# Patient Record
Sex: Male | Born: 1980 | Race: White | Hispanic: No | Marital: Married | Smoking: Never smoker
Health system: Southern US, Community
[De-identification: ages and names within clinical notes are randomized; demographics above are authoritative.]

## PROBLEM LIST (undated history)

## (undated) DIAGNOSIS — E785 Hyperlipidemia, unspecified: Secondary | ICD-10-CM

## (undated) DIAGNOSIS — G8929 Other chronic pain: Secondary | ICD-10-CM

## (undated) DIAGNOSIS — M545 Low back pain, unspecified: Secondary | ICD-10-CM

## (undated) DIAGNOSIS — R079 Chest pain, unspecified: Secondary | ICD-10-CM

## (undated) DIAGNOSIS — N419 Inflammatory disease of prostate, unspecified: Secondary | ICD-10-CM

## (undated) DIAGNOSIS — Z9089 Acquired absence of other organs: Secondary | ICD-10-CM

## (undated) HISTORY — PX: TONSILLECTOMY: SUR1361

## (undated) HISTORY — PX: FOOT SURGERY: SHX648

## (undated) HISTORY — DX: Acquired absence of other organs: Z90.89

## (undated) HISTORY — DX: Low back pain: M54.5

## (undated) HISTORY — DX: Inflammatory disease of prostate, unspecified: N41.9

## (undated) HISTORY — DX: Hyperlipidemia, unspecified: E78.5

## (undated) HISTORY — PX: TARSAL TUNNEL RELEASE: SUR1099

## (undated) HISTORY — DX: Other chronic pain: G89.29

## (undated) HISTORY — DX: Low back pain, unspecified: M54.50

## (undated) HISTORY — DX: Chest pain, unspecified: R07.9

---
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
## 2001-12-20 ENCOUNTER — Encounter: Payer: Self-pay | Admitting: Orthopedic Surgery

## 2001-12-20 ENCOUNTER — Encounter: Admission: RE | Admit: 2001-12-20 | Discharge: 2001-12-20 | Payer: Self-pay | Admitting: Orthopedic Surgery

## 2004-03-19 ENCOUNTER — Encounter: Admission: RE | Admit: 2004-03-19 | Discharge: 2004-03-19 | Payer: Self-pay | Admitting: Specialist

## 2006-11-15 ENCOUNTER — Emergency Department (HOSPITAL_COMMUNITY): Admission: EM | Admit: 2006-11-15 | Discharge: 2006-11-15 | Payer: Self-pay | Admitting: Emergency Medicine

## 2008-09-09 ENCOUNTER — Encounter: Admission: RE | Admit: 2008-09-09 | Discharge: 2008-09-09 | Payer: Self-pay | Admitting: Family Medicine

## 2011-06-21 ENCOUNTER — Encounter: Payer: Self-pay | Admitting: Emergency Medicine

## 2011-06-21 ENCOUNTER — Emergency Department
Admit: 2011-06-21 | Discharge: 2011-06-21 | Disposition: A | Discharge status: Home / Self Care | Payer: 59 | Attending: Emergency Medicine | Admitting: Emergency Medicine

## 2011-06-21 ENCOUNTER — Emergency Department
Admission: EM | Admit: 2011-06-21 | Discharge: 2011-06-21 | Disposition: A | Discharge status: Home / Self Care | Payer: 59 | Source: Home / Self Care | Attending: Emergency Medicine | Admitting: Emergency Medicine

## 2011-06-21 DIAGNOSIS — J0100 Acute maxillary sinusitis, unspecified: Secondary | ICD-10-CM

## 2011-06-21 DIAGNOSIS — J209 Acute bronchitis, unspecified: Secondary | ICD-10-CM

## 2011-06-21 DIAGNOSIS — B9689 Other specified bacterial agents as the cause of diseases classified elsewhere: Secondary | ICD-10-CM

## 2011-06-21 MED ORDER — PROMETHAZINE-CODEINE 6.25-10 MG/5ML PO SYRP: ORAL_SOLUTION | ORAL | Status: AC

## 2011-06-21 MED ORDER — LEVOFLOXACIN 500 MG PO TABS: ORAL_TABLET | ORAL | Status: AC

## 2011-06-21 NOTE — ED Provider Notes (Signed)
History     CSN: [id number]  Arrival date & time 06/21/11  1744   First MD Initiated Contact with Patient 06/21/11 1805      Chief Complaint  Patient presents with  . Cough     Patient is a 31 y.o. male presenting with cough. The history is provided by the patient.  Cough  URI HISTORY  Todd Stanley is a 31 y.o. male who complains of onset of cold symptoms for 4 days.  Have been using over-the-counter treatment which is not helping the cough and congestion  Positive chills/sweats +  Fever to 102  +  Nasal congestion +  Discolored Post-nasal drainage Positive sinus pain/pressure Positive sore throat  +  Severe, hacking cough, productive of green sputum. Occasionally, cough is so severe that it can induce vomiting. No wheezing + chest congestion No hemoptysis No shortness of breath No pleuritic pain  No itchy/red eyes No earache  No nausea Rare vomiting No abdominal pain No diarrhea  No skin rashes +  Fatigue No myalgias No headache   History reviewed. No pertinent past medical history. He received a flu shot about 2 months ago.  Past Surgical History  Procedure Date  . Tonsillectomy     History reviewed. No pertinent family history.  History  Substance Use Topics  . Smoking status: Not on file  . Smokeless tobacco: Not on file  . Alcohol Use:       Review of Systems  Respiratory: Positive for cough.     Allergies  E-mycin  Home Medications   Current Outpatient Rx  Name Route Sig Dispense Refill  . IBUPROFEN 200 MG PO TABS Oral Take 200 mg by mouth every 6 (six) hours as needed.    Marland Kitchen LEVOFLOXACIN 500 MG PO TABS  Take 1 tablet daily for 10 days. 10 tablet 0  . PROMETHAZINE-CODEINE 6.25-10 MG/5ML PO SYRP  Take 1-2 teaspoons every 4-6 hours as needed for cough. May cause drowsiness. 120 mL 0    BP 132/78  Pulse 104  Temp(Src) 98.3 F (36.8 C) (Oral)  Resp 18  Ht 6\' 4"  (1.93 m)  Wt 212 lb (96.163 kg)  BMI 25.81 kg/m2  SpO2 99%  Physical  Exam  Nursing note and vitals reviewed. Constitutional: He is oriented to person, place, and time. He appears well-developed and well-nourished. He appears distressed (Very fatigued, appears uncomfortable from cough, but no acute cardiorespiratory distress. He does not appear toxic. He is alert and cooperative.).  HENT:  Head: Normocephalic and atraumatic.  Right Ear: Tympanic membrane normal.  Left Ear: Tympanic membrane normal.  Nose: Mucosal edema and rhinorrhea present. Right sinus exhibits maxillary sinus tenderness. Right sinus exhibits no frontal sinus tenderness. Left sinus exhibits maxillary sinus tenderness. Left sinus exhibits no frontal sinus tenderness.  Mouth/Throat: Mucous membranes are normal. Posterior oropharyngeal erythema (Mild redness, no exudate. Tonsils are absent.) present. No oropharyngeal exudate, posterior oropharyngeal edema or tonsillar abscesses.       He is very hoarse  Eyes: Right eye exhibits no discharge. Left eye exhibits no discharge. No scleral icterus.  Neck: Neck supple. No Brudzinski's sign and no Kernig's sign noted.  Cardiovascular: Normal rate, regular rhythm and normal heart sounds.   Pulmonary/Chest: No respiratory distress. He has no wheezes. He has rhonchi (Scattered anterior and posterior rhonchi bilaterally). He has no rales.       No definite rales. Initially he had some fine rales bibasilar posterior lung fields, but that cleared after coughing.  Lymphadenopathy:  He has cervical adenopathy (Very mild, mobile, shoddy anterior cervical nodes bilaterally).  Neurological: He is alert and oriented to person, place, and time.  Skin: Skin is warm. No rash noted. He is diaphoretic.    ED Course  Procedures (including critical care time)   Labs Reviewed - No data to display Dg Chest 2 View  06/21/2011  *RADIOLOGY REPORT*  Clinical Data: Cough and fever.  CHEST - 2 VIEW  Comparison: Two-view chest 11/15/2006.  Findings: The heart size is normal.   The lungs are clear.  The visualized soft tissues and bony thorax are unremarkable.  IMPRESSION: Negative chest.  Original Report Authenticated By: Jamesetta Orleans. MATTERN, M.D.     1. Acute bacterial bronchitis   2. Acute maxillary sinusitis       MDM  Discussed treatment options. Reviewed the chest x-ray was within normal limits. See detailed Instructions in AVS, which were given to patient. Verbal instructions also given. Questions invited and answered. Patient voiced understanding and agreement with plans.        Lonell Face, MD 06/21/11 [phone]

## 2011-06-21 NOTE — ED Notes (Signed)
Cough, fever, congestion, green mucus, chills x 4 days

## 2011-10-19 ENCOUNTER — Emergency Department
Admission: EM | Admit: 2011-10-19 | Discharge: 2011-10-19 | Disposition: A | Discharge status: Home / Self Care | Payer: 59 | Source: Home / Self Care | Attending: Emergency Medicine | Admitting: Emergency Medicine

## 2011-10-19 ENCOUNTER — Encounter: Payer: Self-pay | Admitting: Emergency Medicine

## 2011-10-19 DIAGNOSIS — J329 Chronic sinusitis, unspecified: Secondary | ICD-10-CM

## 2011-10-19 MED ORDER — AZITHROMYCIN 250 MG PO TABS: ORAL_TABLET | ORAL | Status: AC

## 2011-10-19 NOTE — ED Provider Notes (Signed)
History     CSN: [id number]  Arrival date & time 10/19/11  4098   First MD Initiated Contact with Patient 10/19/11 1836      Chief Complaint  Patient presents with  . Facial Pain    (Consider location/radiation/quality/duration/timing/severity/associated sxs/prior treatment) HPI Todd Stanley is a 31 y.o. male who complains of onset of cold symptoms for 10 days.  The symptoms are constant and mild-moderate in severity.  He is taking over-the-counter generic Claritin and some Sudafed as well.  It is helping a little bit but he thinks that the sinus pressure is worsening.   No sore throat + cough No pleuritic pain No wheezing + nasal congestion + post-nasal drainage ++ sinus pain/pressure with green discharge  No chest congestion No itchy/red eyes No earache No hemoptysis No SOB No chills/sweats No fever No nausea No vomiting No abdominal pain No diarrhea No skin rashes No fatigue No myalgias No headache    History reviewed. No pertinent past medical history.  Past Surgical History  Procedure Date  . Tonsillectomy     History reviewed. No pertinent family history.  History  Substance Use Topics  . Smoking status: Not on file  . Smokeless tobacco: Not on file  . Alcohol Use:       Review of Systems  All other systems reviewed and are negative.    Allergies  E-mycin  Home Medications   Current Outpatient Rx  Name Route Sig Dispense Refill  . PSEUDOEPHEDRINE HCL 30 MG PO TABS Oral Take 30 mg by mouth every 4 (four) hours as needed.    . AZITHROMYCIN 250 MG PO TABS  Use as directed 1 each 0  . IBUPROFEN 200 MG PO TABS Oral Take 200 mg by mouth every 6 (six) hours as needed.      BP 126/85  Pulse 82  Temp(Src) 97.3 F (36.3 C) (Oral)  Resp 16  Ht 6\' 4"  (1.93 m)  Wt 214 lb (97.07 kg)  BMI 26.05 kg/m2  SpO2 98%  Physical Exam  Nursing note and vitals reviewed. Constitutional: He is oriented to person, place, and time. He appears well-developed  and well-nourished.  HENT:  Head: Normocephalic and atraumatic.  Right Ear: Tympanic membrane, external ear and ear canal normal.  Left Ear: External ear and ear canal normal. Tympanic membrane is erythematous. A middle ear effusion is present.  Nose: Mucosal edema present.  Mouth/Throat: Mucous membranes are normal. No oropharyngeal exudate, posterior oropharyngeal edema or posterior oropharyngeal erythema.  Eyes: No scleral icterus.  Neck: Neck supple.  Cardiovascular: Regular rhythm and normal heart sounds.   Pulmonary/Chest: Effort normal and breath sounds normal. No respiratory distress.  Neurological: He is alert and oriented to person, place, and time.  Skin: Skin is warm and dry.  Psychiatric: He has a normal mood and affect. His speech is normal.    ED Course  Procedures (including critical care time)  Labs Reviewed - No data to display No results found.   1. Sinusitis       MDM  1)  Take the prescribed antibiotic as instructed.  He reports an allergy to erythromycin, but but has taken Z-Pak in the past which has worked for him and he has not had any side effects with that.  If his symptoms are lingering past 4 days or so, he will call back and we can call in a second Z-Pak.  He specifically asked that we cannot treat him with amoxicillin because he has used it so  many times previously. 2)  Use nasal saline solution (over the counter) at least 3 times a day. 3)  Use over the counter decongestants like Zyrtec-D every 12 hours as needed to help with congestion.  If you have hypertension, do not take medicines with sudafed.  4)  Can take tylenol every 6 hours or motrin every 8 hours for pain or fever. 5)  Follow up with your primary doctor if no improvement in 5-7 days, sooner if increasing pain, fever, or new symptoms.     Marlaine Hind, MD 10/19/11 [phone]

## 2011-10-19 NOTE — ED Notes (Signed)
Sinus pressure, pain, mucus went from clear to green x 10 days

## 2011-10-23 ENCOUNTER — Telehealth: Payer: Self-pay | Admitting: Emergency Medicine

## 2011-11-04 ENCOUNTER — Telehealth: Payer: Self-pay | Admitting: Family Medicine

## 2011-11-04 NOTE — Telephone Encounter (Signed)
Message copied by Royetta Asal on Sat Nov 04, 2011  4:07 PM ------      Message from: Donna Christen A      Created: Mon Oct 23, 2011  2:50 PM      Regarding: RE: another course of antibiotics?       May call in another RX for a Z-pack.      ----- Message -----         From: Royetta Asal, RN         Sent: 10/23/2011  12:52 PM           To: Lattie Haw, MD      Subject: another course of antibiotics?                           Pt. States he has completed Z-pack and not recovered; wonders if another course of this would be appropriate?

## 2012-01-25 ENCOUNTER — Emergency Department (INDEPENDENT_AMBULATORY_CARE_PROVIDER_SITE_OTHER): Payer: 59

## 2012-01-25 ENCOUNTER — Encounter: Payer: Self-pay | Admitting: *Deleted

## 2012-01-25 ENCOUNTER — Emergency Department
Admission: EM | Admit: 2012-01-25 | Discharge: 2012-01-25 | Disposition: A | Discharge status: Home / Self Care | Payer: 59 | Source: Home / Self Care

## 2012-01-25 DIAGNOSIS — R05 Cough: Secondary | ICD-10-CM

## 2012-01-25 DIAGNOSIS — J189 Pneumonia, unspecified organism: Secondary | ICD-10-CM

## 2012-01-25 MED ORDER — HYDROCOD POLST-CHLORPHEN POLST 10-8 MG/5ML PO LQCR: 5.0000 mL | Freq: Two times a day (BID) | ORAL | Status: DC | PRN

## 2012-01-25 MED ORDER — DOXYCYCLINE HYCLATE 100 MG PO CAPS: 100.0000 mg | ORAL_CAPSULE | Freq: Two times a day (BID) | ORAL | Status: AC

## 2012-01-25 MED ORDER — CEFTRIAXONE SODIUM 1 G IJ SOLR
1.0000 g | Freq: Once | INTRAMUSCULAR | Status: AC
  Administered 2012-01-25: 1 g via INTRAMUSCULAR

## 2012-01-25 NOTE — ED Notes (Addendum)
Patient swallowed a lifesaver 1 week ago. About 6 hours later is when cough started. Dry cough without associated symptoms or fever. Used nyquil and cough drops otc.

## 2012-01-25 NOTE — ED Provider Notes (Signed)
History     CSN: [id number]  Arrival date & time 01/25/12  4098   First MD Initiated Contact with Patient 01/25/12 1815      Chief Complaint  Patient presents with  . Cough   HPI Patient presents today with chief complaint of cough. Patient states that he had nearly choked on lifesaver approximately one week ago. Patient states he drank a large couple water and was able to clear to lifesaver. Patient states that since this point he has had persistent cough without relief. Patient states he's been trying over-the-counter medications include cough drops with minimal improvement in symptoms. No fevers, chills, shortness of breath, wheezing. Patient states he has developed anterior chest pleuritic chest pain with coughing. No nausea diaphoresis. Cough is nonproductive. No prior history smoking. Currently not wheezing. Currently not on medications including ACE inhibitor. Otherwise healthy. No rhinorrhea. Patient states that cough is gone to the point to where he cannot speak in full sentences secondary to persistence of cough. History reviewed. No pertinent past medical history.  Past Surgical History  Procedure Date  . Tonsillectomy     History reviewed. No pertinent family history.  History  Substance Use Topics  . Smoking status: Never Smoker   . Smokeless tobacco: Not on file  . Alcohol Use: No      Review of Systems  Constitutional: Negative for fever, chills, diaphoresis, fatigue and unexpected weight change.  HENT: Negative for facial swelling, rhinorrhea, neck stiffness and postnasal drip.   Respiratory: Positive for cough. Negative for chest tightness, shortness of breath, wheezing and stridor.   Cardiovascular: Positive for chest pain.  Gastrointestinal: Negative for nausea, abdominal pain and diarrhea.  Neurological: Positive for headaches.  Hematological: Negative for adenopathy.    Allergies  E-mycin  Home Medications   Current Outpatient Rx  Name Route Sig  Dispense Refill  . IBUPROFEN 200 MG PO TABS Oral Take 200 mg by mouth every 6 (six) hours as needed.    Marland Kitchen PSEUDOEPHEDRINE HCL 30 MG PO TABS Oral Take 30 mg by mouth every 4 (four) hours as needed.      BP 141/93  Pulse 113  Temp 98 F (36.7 C) (Oral)  Resp 16  Ht 6\' 4"  (1.93 m)  Wt 209 lb (94.802 kg)  BMI 25.44 kg/m2  SpO2 98%  Physical Exam  Constitutional: He is oriented to person, place, and time. He appears well-developed and well-nourished. No distress.       Actively coughing.   HENT:  Head: Normocephalic and atraumatic.  Eyes: Conjunctivae are normal. Pupils are equal, round, and reactive to light.  Neck: Normal range of motion. Neck supple.  Cardiovascular: Normal rate and regular rhythm.   Pulmonary/Chest: Effort normal and breath sounds normal. No respiratory distress. He has no wheezes. He has no rales.  Abdominal: Soft. Bowel sounds are normal.  Musculoskeletal: Normal range of motion.  Lymphadenopathy:    He has no cervical adenopathy.  Neurological: He is alert and oriented to person, place, and time.  Skin: Skin is warm.    ED Course  Procedures (including critical care time)  Labs Reviewed - No data to display Dg Chest 2 View  01/25/2012  *RADIOLOGY REPORT*  Clinical Data: History of cough.  CHEST - 2 VIEW  Comparison: Chest x-ray 06/21/2011.  Findings: Lung volumes are normal.  No consolidative airspace disease.  No pleural effusions.  No pneumothorax.  No pulmonary nodule or mass noted.  Pulmonary vasculature and the cardiomediastinal silhouette are within  normal limits.  IMPRESSION: 1. No radiographic evidence of acute cardiopulmonary disease.   Original Report Authenticated By: Florencia Reasons, M.D.     1. Cough  2. Pneumonia    MDM  Suspect that cough is likely secondary to aspiration event. Will cover for pneumonia  with Rocephin 1 g IM x1 and doxycycline 100 mg by mouth twice a day x7 days. (Noted allergy to macrolides) Will place on Tussionex  for cough. Overall respiratory infectious red flags were discussed with patient Given the patient is currently presenting in the outpatient setting with no prior history of respiratory disease or recent hospitalizations, will treat this along the outpatient pneumonia algorithm.  If cough persists, would consider escalation antibiotics or respiratory flora quinolone versus referral to ENT for laryngoscopy. Handout given.     The patient and/or caregiver has been counseled thoroughly with regard to treatment plan and/or medications prescribed including dosage, schedule, interactions, rationale for use, and possible side effects and they verbalize understanding. Diagnoses and expected course of recovery discussed and will return if not improved as expected or if the condition worsens. Patient and/or caregiver verbalized understanding.            Doree Albee, MD 01/25/12 [phone]

## 2012-03-10 ENCOUNTER — Emergency Department
Admission: EM | Admit: 2012-03-10 | Discharge: 2012-03-10 | Disposition: A | Discharge status: Home / Self Care | Payer: 59 | Source: Home / Self Care | Attending: Family Medicine | Admitting: Family Medicine

## 2012-03-10 DIAGNOSIS — J029 Acute pharyngitis, unspecified: Secondary | ICD-10-CM

## 2012-03-10 MED ORDER — PENICILLIN V POTASSIUM 500 MG PO TABS: ORAL_TABLET | ORAL | Status: DC

## 2012-03-10 NOTE — ED Provider Notes (Signed)
History     CSN: [id number]  Arrival date & time 03/10/12  1404   First MD Initiated Contact with Patient 03/10/12 1415      Chief Complaint  Patient presents with  . Sore Throat    x 4 days      HPI Comments: Patient complains of onset of sore throat 4 days ago, gradually becoming worse.  He has had fever for the past two days, increased to 103 today.  No cough or URI symptoms.  He states that he has had strep pharyngitis in the past documented only by throat culture (with negative rapid strep tests).   History reviewed. No pertinent past medical history.  Past Surgical History  Procedure Date  . Tonsillectomy     Family History  Problem Relation Age of Onset  . Hyperlipidemia Mother   . Hypertension Mother   . Cancer Father     testicular  . Hypertension Father   . Hyperlipidemia Father   . Cancer Sister     thyroid    History  Substance Use Topics  . Smoking status: Never Smoker   . Smokeless tobacco: Not on file  . Alcohol Use: No      Review of Systems + sore throat No cough No pleuritic pain No wheezing No nasal congestion No post-nasal drainage No sinus pain/pressure No itchy/red eyes ? earache No hemoptysis No SOB + fever, + chills No nausea No vomiting No abdominal pain No diarrhea No urinary symptoms No skin rashes + fatigue + myalgias No headache Used OTC meds without relief  Allergies  E-mycin  Home Medications   Current Outpatient Rx  Name Route Sig Dispense Refill  . IBUPROFEN 200 MG PO TABS Oral Take 200 mg by mouth every 6 (six) hours as needed.    Marland Kitchen HYDROCOD POLST-CPM POLST ER 10-8 MG/5ML PO LQCR Oral Take 5 mLs by mouth every 12 (twelve) hours as needed (cough). 60 mL 0  . PENICILLIN V POTASSIUM 500 MG PO TABS  Take one tab by mouth twice daily for 10 days 20 tablet 0  . PSEUDOEPHEDRINE HCL 30 MG PO TABS Oral Take 30 mg by mouth every 4 (four) hours as needed.      BP 137/86  Pulse 119  Temp 101.3 F (38.5 C)  (Oral)  Resp 18  Ht 6\' 4"  (1.93 m)  Wt 214 lb (97.07 kg)  BMI 26.05 kg/m2  SpO2 97%  Physical Exam Nursing notes and Vital Signs reviewed. Appearance:  Patient appears healthy, stated age, and in no acute distress Eyes:  Pupils are equal, round, and reactive to light and accomodation.  Extraocular movement is intact.  Conjunctivae are not inflamed  Ears:  Canals normal.  Tympanic membranes normal.  Nose:  Mildly congested turbinates.  No sinus tenderness.   Pharynx:  Mildly erythematous without swelling or exudate Neck:  Supple.   Tender anterior/posterior nodes are palpated bilaterally, worse on left  Lungs:  Clear to auscultation.  Breath sounds are equal.  Heart:  Regular rate and rhythm without murmurs, rubs, or gallops.  Abdomen:   Mild tenderness over spleen without masses or hepatosplenomegaly.  Bowel sounds are present.  No CVA or flank tenderness.  Extremities:  No edema.  No calf tenderness Skin:  No rash present.   ED Course  Procedures  none   Labs Reviewed  POCT RAPID STREP A (OFFICE) - Negative  STREP A DNA PROBE pending       1. Sore throat  MDM  Throat culture pending.  Centor score 3.  Patient has had a number of episodes of strep pharyngitis in the past with negative rapid strep tests, but positive cultures.  Therefore will empirically begin penicillin. May take Ibuprofen 200mg , 4 tabs every 8 hours with food.  Try warm salt water gargles. Followup with Family Doctor if not improved in one week.         Lattie Haw, MD 03/10/12 1444

## 2012-03-10 NOTE — ED Notes (Signed)
Todd Stanley complains of a sore throat, body aches, fever, chills and sweats for 4 days. He feels that he is getting worse. He has taken Advil and Aleve for the sore throat. The medication has helped but wears off.

## 2012-03-12 ENCOUNTER — Telehealth: Payer: Self-pay | Admitting: *Deleted

## 2012-05-07 ENCOUNTER — Emergency Department
Admission: EM | Admit: 2012-05-07 | Discharge: 2012-05-07 | Disposition: A | Discharge status: Home / Self Care | Payer: 59 | Source: Home / Self Care | Attending: Family Medicine | Admitting: Family Medicine

## 2012-05-07 ENCOUNTER — Encounter: Payer: Self-pay | Admitting: *Deleted

## 2012-05-07 DIAGNOSIS — J069 Acute upper respiratory infection, unspecified: Secondary | ICD-10-CM

## 2012-05-07 DIAGNOSIS — J40 Bronchitis, not specified as acute or chronic: Secondary | ICD-10-CM

## 2012-05-07 MED ORDER — METHYLPREDNISOLONE SODIUM SUCC 125 MG IJ SOLR
125.0000 mg | Freq: Once | INTRAMUSCULAR | Status: AC
  Administered 2012-05-07: 125 mg via INTRAMUSCULAR

## 2012-05-07 MED ORDER — DOXYCYCLINE HYCLATE 100 MG PO CAPS: 100.0000 mg | ORAL_CAPSULE | Freq: Two times a day (BID) | ORAL | Status: AC

## 2012-05-07 MED ORDER — HYDROCOD POLST-CHLORPHEN POLST 10-8 MG/5ML PO LQCR: 5.0000 mL | Freq: Two times a day (BID) | ORAL | Status: DC | PRN

## 2012-05-07 NOTE — ED Notes (Signed)
Pt c/o non productive cough, RT side sinus pain, and nasal congestion x 2 wks, worse x 3 days. He has taken nyquil and dayquil. Denies fever.

## 2012-05-07 NOTE — ED Provider Notes (Signed)
History     CSN: [id number]  Arrival date & time 05/07/12  1916   First MD Initiated Contact with Patient 05/07/12 1923      Chief Complaint  Patient presents with  . Cough   HPI URI Symptoms Onset: 2 weeks  Description: rhinorrhea, nasal congestion, cough, mild wheezing  Modifying factors:  none  Symptoms Nasal discharge: yes Fever: no Sore throat: no Cough: yes Wheezing: mild Ear pain: no GI symptoms: no Sick contacts: yes  Red Flags  Stiff neck: no Dyspnea: mild after extended periods of coughing  Rash: no Swallowing difficulty: no  Sinusitis Risk Factors Headache/face pain: no Double sickening: no tooth pain: no  Allergy Risk Factors Sneezing: no Itchy scratchy throat: no Seasonal symptoms: no  Flu Risk Factors Headache: no muscle aches: no severe fatigue: no   History reviewed. No pertinent past medical history.  Past Surgical History  Procedure Date  . Tonsillectomy   . Tarsal tunnel release     Family History  Problem Relation Age of Onset  . Hyperlipidemia Mother   . Hypertension Mother   . Cancer Father     testicular  . Hypertension Father   . Hyperlipidemia Father   . Cancer Sister     thyroid    History  Substance Use Topics  . Smoking status: Never Smoker   . Smokeless tobacco: Not on file  . Alcohol Use: No      Review of Systems  All other systems reviewed and are negative.    Allergies  E-mycin  Home Medications   Current Outpatient Rx  Name  Route  Sig  Dispense  Refill  . HYDROCOD POLST-CPM POLST ER 10-8 MG/5ML PO LQCR   Oral   Take 5 mLs by mouth every 12 (twelve) hours as needed (cough).   60 mL   0   . IBUPROFEN 200 MG PO TABS   Oral   Take 200 mg by mouth every 6 (six) hours as needed.         Marland Kitchen PENICILLIN V POTASSIUM 500 MG PO TABS      Take one tab by mouth twice daily for 10 days   20 tablet   0   . PSEUDOEPHEDRINE HCL 30 MG PO TABS   Oral   Take 30 mg by mouth every 4 (four) hours  as needed.           BP 147/90  Pulse 96  Temp 98.4 F (36.9 C) (Oral)  Resp 18  Ht 6\' 4"  (1.93 m)  Wt 214 lb (97.07 kg)  BMI 26.05 kg/m2  SpO2 98%  Physical Exam  Constitutional: He appears well-developed and well-nourished.  HENT:  Head: Normocephalic and atraumatic.  Right Ear: External ear normal.  Left Ear: External ear normal.       +nasal erythema, rhinorrhea bilaterally, + post oropharyngeal erythema    Eyes: Conjunctivae normal are normal. Pupils are equal, round, and reactive to light.  Neck: Normal range of motion. Neck supple.  Cardiovascular: Normal rate, regular rhythm and normal heart sounds.   Pulmonary/Chest: Effort normal and breath sounds normal.  Abdominal: Soft.  Musculoskeletal: Normal range of motion.  Lymphadenopathy:    He has no cervical adenopathy.  Neurological: He is alert.  Skin: Skin is warm.    ED Course  Procedures (including critical care time)  Labs Reviewed - No data to display No results found.   1. URI (upper respiratory infection)   2. Bronchitis  MDM  Tussionex for cough  Doxy for lower resp tract coverage (macrolide allergy).  Solumedrol 125mg  IM x1 for wheezing.  Discussed general care and infectious/resp red flags.  Follow up as needed.      The patient and/or caregiver has been counseled thoroughly with regard to treatment plan and/or medications prescribed including dosage, schedule, interactions, rationale for use, and possible side effects and they verbalize understanding. Diagnoses and expected course of recovery discussed and will return if not improved as expected or if the condition worsens. Patient and/or caregiver verbalized understanding.             Doree Albee, MD 05/07/12 2001

## 2015-05-09 ENCOUNTER — Emergency Department
Admission: EM | Admit: 2015-05-09 | Discharge: 2015-05-09 | Disposition: A | Discharge status: Home / Self Care | Payer: 59 | Source: Home / Self Care | Attending: Family Medicine | Admitting: Family Medicine

## 2015-05-09 ENCOUNTER — Encounter: Payer: Self-pay | Admitting: Emergency Medicine

## 2015-05-09 ENCOUNTER — Emergency Department (INDEPENDENT_AMBULATORY_CARE_PROVIDER_SITE_OTHER): Payer: 59

## 2015-05-09 DIAGNOSIS — R05 Cough: Secondary | ICD-10-CM

## 2015-05-09 DIAGNOSIS — J069 Acute upper respiratory infection, unspecified: Secondary | ICD-10-CM

## 2015-05-09 DIAGNOSIS — B9789 Other viral agents as the cause of diseases classified elsewhere: Principal | ICD-10-CM

## 2015-05-09 MED ORDER — DOXYCYCLINE HYCLATE 100 MG PO CAPS: 100.0000 mg | ORAL_CAPSULE | Freq: Two times a day (BID) | ORAL | Status: DC

## 2015-05-09 MED ORDER — GUAIFENESIN-CODEINE 100-10 MG/5ML PO SOLN: ORAL | Status: DC

## 2015-05-09 MED ORDER — PREDNISONE 20 MG PO TABS: 20.0000 mg | ORAL_TABLET | Freq: Two times a day (BID) | ORAL | Status: DC

## 2015-05-09 NOTE — ED Notes (Signed)
Reports cough worsening over past week; non-productive; no fever; did have flu vaccination this season.

## 2015-05-09 NOTE — Discharge Instructions (Signed)
Take plain guaifenesin (1200mg  extended release tabs such as Mucinex) twice daily, with plenty of water, for cough and congestion.   Try warm salt water gargles for sore throat.  Stop all antihistamines for now, and other non-prescription cough/cold preparations. Begin Doxycycline if not improving about one week or if persistent fever develops   Follow-up with family doctor if not improving about10 days.

## 2015-05-09 NOTE — ED Provider Notes (Signed)
CSN: [id number]     Arrival date & time 05/09/15  1102 History   First MD Initiated Contact with Patient 05/09/15 1141     Chief Complaint  Patient presents with  . Cough      HPI Comments: Patient complains of one week history of typical cold-like symptoms including mild sore throat, sinus congestion, fatigue, and cough.  He notes that he has developed mild wheezing.  The history is provided by the patient.    History reviewed. No pertinent past medical history. Past Surgical History  Procedure Laterality Date  . Tonsillectomy    . Tarsal tunnel release     Family History  Problem Relation Age of Onset  . Hyperlipidemia Mother   . Hypertension Mother   . Cancer Father     testicular  . Hypertension Father   . Hyperlipidemia Father   . Cancer Sister     thyroid   Social History  Substance Use Topics  . Smoking status: Never Smoker   . Smokeless tobacco: None  . Alcohol Use: No    Review of Systems + sore throat + hoarse + cough No pleuritic pain + wheezing + nasal congestion + post-nasal drainage No sinus pain/pressure No itchy/red eyes No earache No hemoptysis No SOB No fever/chills No nausea No vomiting No abdominal pain No diarrhea No urinary symptoms No skin rash + fatigue No myalgias No headache Used OTC meds without relief  Allergies  E-mycin  Home Medications   Prior to Admission medications   Medication Sig Start Date End Date Taking? Authorizing Provider  chlorpheniramine-HYDROcodone (TUSSIONEX PENNKINETIC ER) 10-8 MG/5ML LQCR Take 5 mLs by mouth every 12 (twelve) hours as needed (cough). 01/25/12   Floydene Flock, MD  chlorpheniramine-HYDROcodone [facility] PENNKINETIC ER) 10-8 MG/5ML LQCR Take 5 mLs by mouth every 12 (twelve) hours as needed (cough). 05/07/12   Floydene Flock, MD  doxycycline (VIBRAMYCIN) 100 MG capsule Take 1 capsule (100 mg total) by mouth 2 (two) times daily. Take with food (Rx void after 05/17/15) 05/09/15    Lattie Haw, MD  guaiFENesin-codeine 100-10 MG/5ML syrup Take 10mL by mouth at bedtime as needed for cough 05/09/15   Lattie Haw, MD  ibuprofen (ADVIL,MOTRIN) 200 MG tablet Take 200 mg by mouth every 6 (six) hours as needed.    Historical Provider, MD  penicillin v potassium (VEETID) 500 MG tablet Take one tab by mouth twice daily for 10 days 03/10/12   Lattie Haw, MD  predniSONE (DELTASONE) 20 MG tablet Take 1 tablet (20 mg total) by mouth 2 (two) times daily. Take with food. 05/09/15   Lattie Haw, MD  pseudoephedrine (SUDAFED) 30 MG tablet Take 30 mg by mouth every 4 (four) hours as needed.    Historical Provider, MD   Meds Ordered and Administered this Visit  Medications - No data to display  BP 139/88 mmHg  Pulse 104  Temp(Src) 98 F (36.7 C) (Oral)  Resp 16  Ht  (1.93 m)  Wt 210 lb (95.255 kg)  BMI 25.57 kg/m2  SpO2 98% No data found.   Physical Exam Nursing notes and Vital Signs reviewed. Appearance:  Patient appears stated age, and in no acute distress Eyes:  Pupils are equal, round, and reactive to light and accomodation.  Extraocular movement is intact.  Conjunctivae are not inflamed  Ears:  Canals normal.  Tympanic membranes normal.  Nose:  Congested turbinates.  No sinus tenderness.  Pharynx:   Uvula slightly erythematous  Neck:  Supple.  Tender enlarged posterior nodes are palpated bilaterally  Lungs:  Clear to auscultation.  Breath sounds are equal.  Moving air well. Heart:  Regular rate and rhythm without murmurs, rubs, or gallops.  Abdomen:  Nontender without masses or hepatosplenomegaly.  Bowel sounds are present.  No CVA or flank tenderness.  Extremities:  No edema.   Skin:  No rash present.   ED Course  Procedures  None   Imaging Review Dg Chest 2 View  05/09/2015  CLINICAL DATA:  Cough for 1 week, right side chest pain EXAM: CHEST  2 VIEW COMPARISON:  01/25/2012 FINDINGS: The heart size and mediastinal contours are within normal  limits. Both lungs are clear. The visualized skeletal structures are unremarkable. IMPRESSION: No active cardiopulmonary disease. Electronically Signed   By: Natasha MeadLiviu  Pop M.D.   On: 05/09/2015 12:17    MDM   1. Viral URI with cough    There is no evidence of bacterial infection today.  Treat symptomatically for now  Begin prednisone burst. Take plain guaifenesin (1200mg  extended release tabs such as Mucinex) twice daily, with plenty of water, for cough and congestion.   Try warm salt water gargles for sore throat.  Stop all antihistamines for now, and other non-prescription cough/cold preparations. Begin Doxycycline if not improving about one week or if persistent fever develops (Given a prescription to hold, with an expiration date)  Follow-up with family doctor if not improving about10 days.    Lattie HawStephen A Smrithi Pigford, MD 05/14/15 [phone]

## 2016-08-23 ENCOUNTER — Ambulatory Visit: Payer: BLUE CROSS/BLUE SHIELD | Admitting: Cardiology

## 2016-09-13 ENCOUNTER — Ambulatory Visit: Payer: BLUE CROSS/BLUE SHIELD | Admitting: Cardiology

## 2016-09-23 IMAGING — CR DG CHEST 2V
2 series · 2 of 2 positions shown · non-contrast
Comparison: 01/25/2012

CLINICAL DATA: Cough for 1 week, right side chest pain

EXAM:
CHEST  2 VIEW

[chest pa]
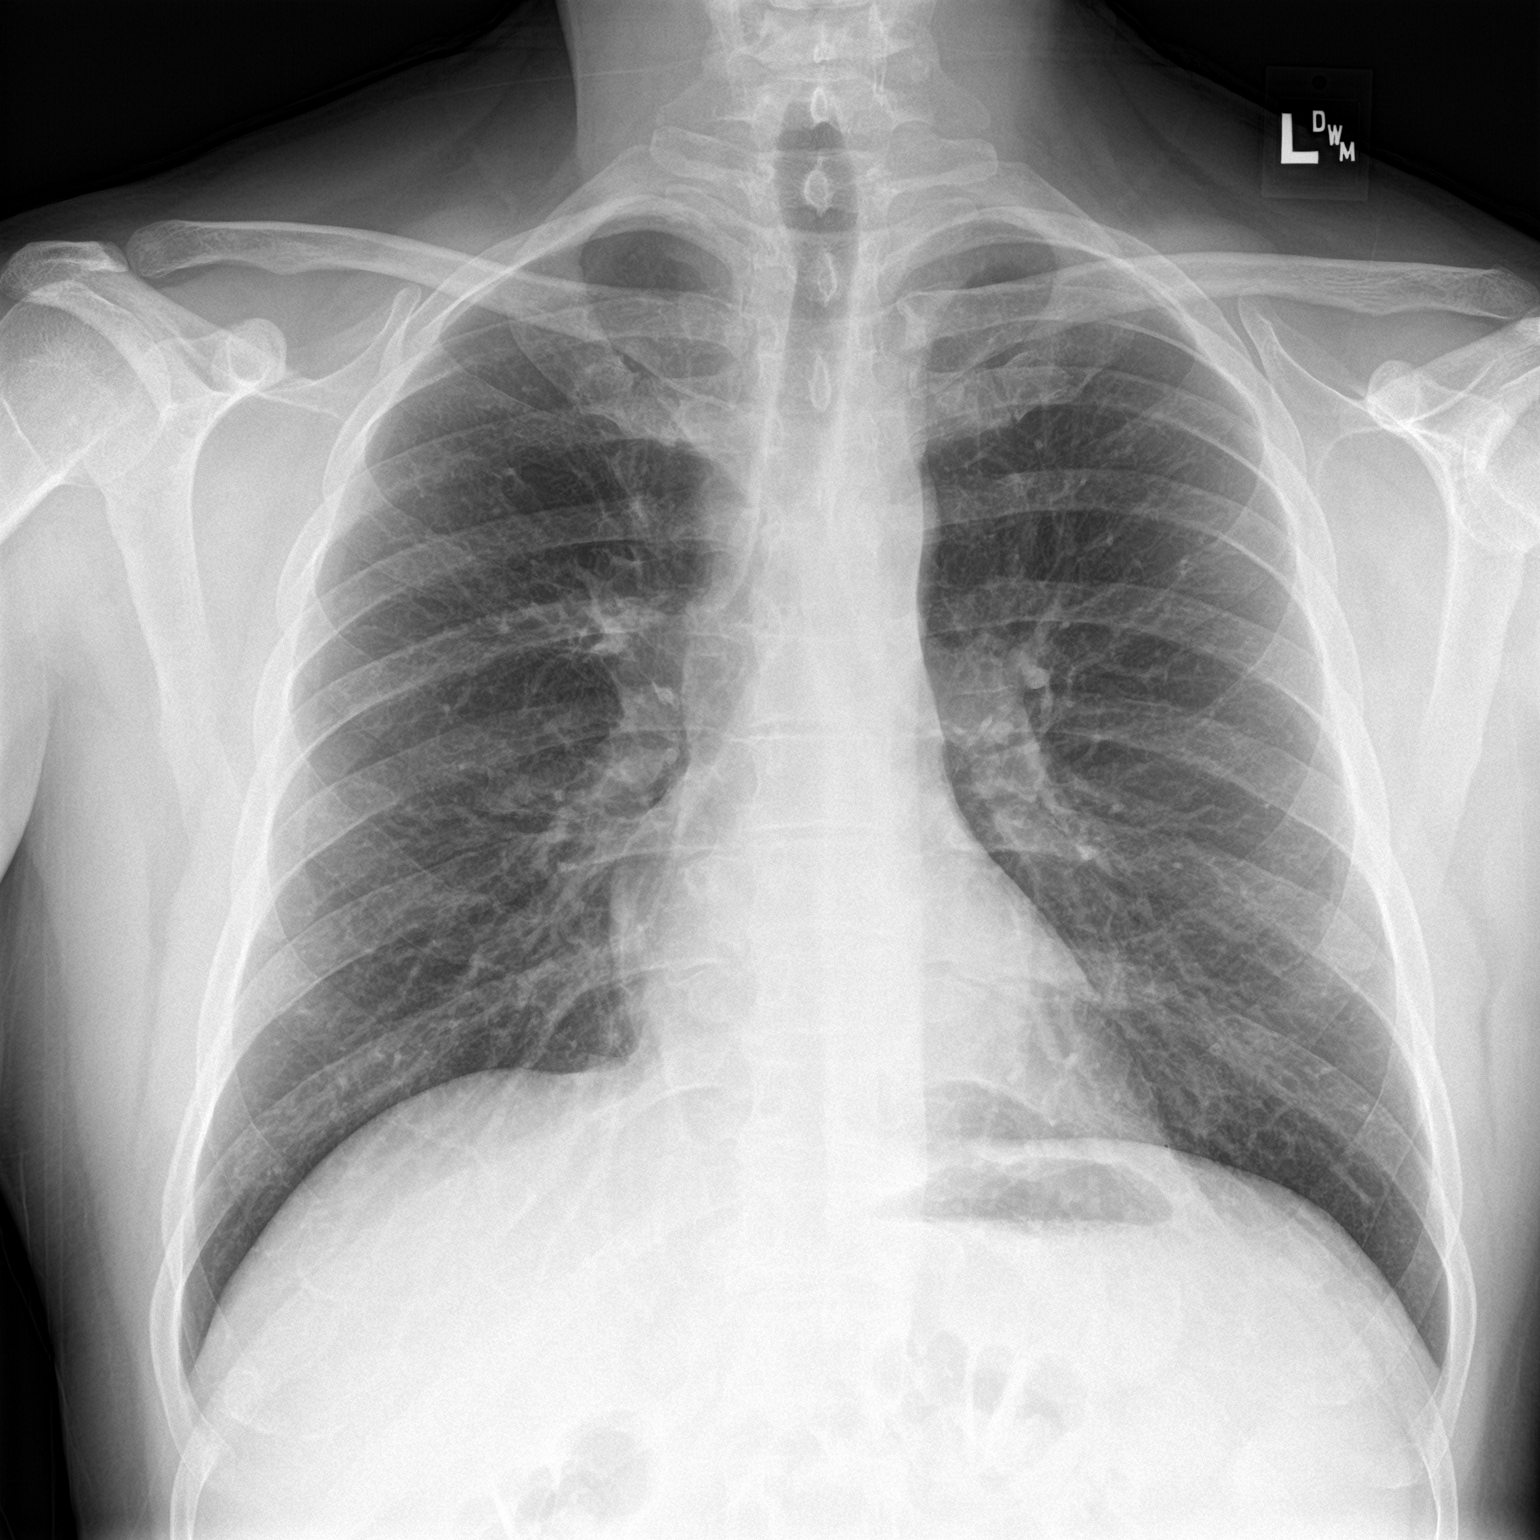

[chest lat]
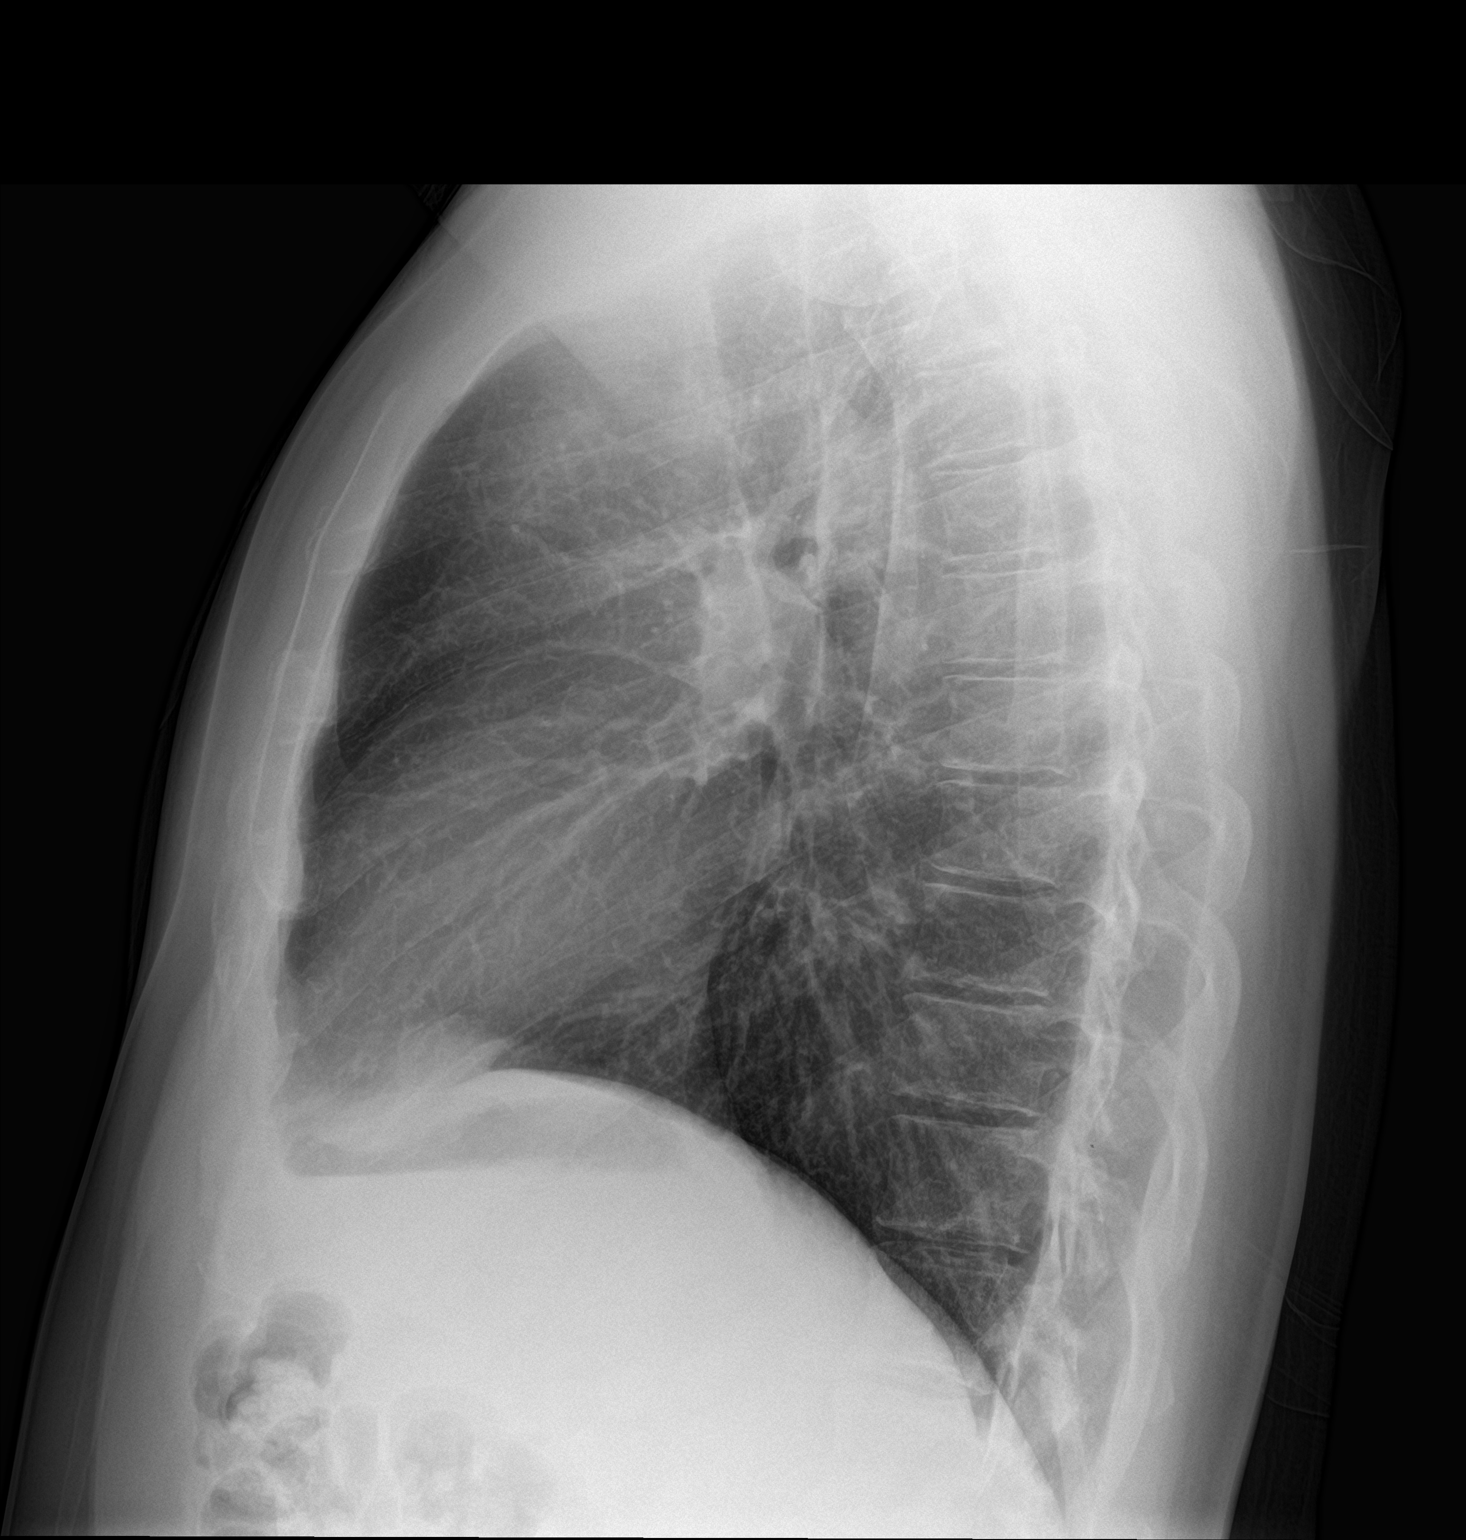

[2 of 2 positions shown; findings below may reference images not displayed]

FINDINGS: The heart size and mediastinal contours are within normal limits.
Both lungs are clear. The visualized skeletal structures are
unremarkable.
IMPRESSION: No active cardiopulmonary disease.

## 2016-10-06 ENCOUNTER — Encounter: Payer: Self-pay | Admitting: *Deleted

## 2016-10-16 ENCOUNTER — Ambulatory Visit (INDEPENDENT_AMBULATORY_CARE_PROVIDER_SITE_OTHER): Payer: BLUE CROSS/BLUE SHIELD | Admitting: Cardiology

## 2016-10-16 ENCOUNTER — Encounter: Payer: Self-pay | Admitting: Cardiology

## 2016-10-16 VITALS — BP 148/90 | HR 77 | Ht 76.0 in | Wt 220.8 lb

## 2016-10-16 DIAGNOSIS — R9431 Abnormal electrocardiogram [ECG] [EKG]: Secondary | ICD-10-CM | POA: Diagnosis not present

## 2016-10-16 DIAGNOSIS — R0789 Other chest pain: Secondary | ICD-10-CM

## 2016-10-16 NOTE — Progress Notes (Signed)
PCP: Todd Stanley, William, MD  Clinic Note: Chief Complaint  Patient presents with  . New Patient (Initial Visit)    Chest pain, abnormal EKG    HPI: Todd Stanley is a 36 y.o. male who is being seen today for the evaluation of chest pain at the request of Scifres, Nicole CellaDorothy, PA-C & Todd Stanley, William, MD  Todd LinesDaniel W Stanley was last seen on August 14, 2016 by Scifres, Nicole Cellaorothy, PA-C for evaluation of left-sided chest pressure that woke him up from sleep. The symptom lasted roughly 2 days. Was described as left-sided burning pressure type sensation that was persistent. It would sometimes worsen with deep inspiration as a stabbing discomfort. He couldn't recall any associated activity that may have been related to it. He also denied any exacerbation of symptoms with irregular movements or activity. In fact he is able to do whatever exertion he wants to do without exacerbation. When the pain first came on he was diaphoretic and fatigued and dyspneic, but then it resolved.  Recent Hospitalizations: None  Studies Personally Reviewed - (if available, images/films reviewed: From Epic Chart or Care Everywhere)  EKG from PCPs office showed sinus rhythm heart rate 77 beats a minute with axis leftward (-34). QRS widening. Was not consistent with left anterior fascicular block.  Interval History: Todd BoomDaniel presents today stating that since that episode a few months ago he has not really had any further pain. All told the symptom lasted about 2 day. He's never had anything like it before and has not had anything since. He is back to his full level activity with no further symptoms.  He states that while the pain was going on he had no problems going up and down a flight of steps in her knee without any difficulty. 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 the day before. He thinks it may be pulled a muscle. Other than that 2 days pressure and tightness sensation in the left  side of his chest (which was really just left of the sternum and up underneath the nipple), he really has not had any other untoward symptoms. He denies any rapid irregular heartbeats palpitations. No PND, orthopnea, or edema.  Besides some mild lightheadedness when he first had onset of symptoms, no dizziness wooziness, syncope/near syncope or TIA/amaurosis fugax.  Once the pain subsided, he not had any further chest tightness or pressure with rest or exertion.  No claudication.  ROS: A comprehensive was performed. Review of Systems  Constitutional: Negative for malaise/fatigue and weight loss.  HENT: Negative for nosebleeds.   Respiratory: Negative for cough, sputum production and wheezing.   Cardiovascular:       Per history of present illness  Gastrointestinal: Negative for blood in stool and melena.  Genitourinary: Negative for hematuria.  Musculoskeletal: Negative.   Neurological:       Dizziness per history of present illness  Endo/Heme/Allergies: Negative for environmental allergies.  Psychiatric/Behavioral: Negative.   All other systems reviewed and are negative.  I have reviewed and (if needed) personally updated the patient's problem list, medications, allergies, past medical and surgical history, social and family history.   Past Medical History:  Diagnosis Date  . Chest pain   . Chronic low back pain   . Hyperlipidemia   . Prostatitis    History of  . S/P tonsillectomy     Past Surgical History:  Procedure Laterality Date  . FOOT SURGERY Bilateral    x3 Fusion subtalar arthrodesis  .  TARSAL TUNNEL RELEASE    . TONSILLECTOMY      Current Meds  Medication Sig  . HYDROcodone-acetaminophen (NORCO/VICODIN) 5-325 MG tablet Take 1 tablet by mouth as directed.  . lidocaine (XYLOCAINE) 2 % solution Use as directed 15 mLs in the mouth or throat as directed.  . loratadine (CLARITIN) 10 MG tablet Take 10 mg by mouth daily.    Allergies  Allergen Reactions  .  E-Mycin [Erythromycin Base] Rash    Social History   Social History  . Marital status: Married    Spouse name: N/A  . Number of children: 2  . Years of education: 16   Occupational History  . Analyst     [employer]   Social History Main Topics  . Smoking status: Never Smoker  . Smokeless tobacco: Never Used  . Alcohol use No  . Drug use: No  . Sexual activity: Yes   Other Topics Concern  . None   Social History Narrative   Married with one boy and one girl.   No structured exercise program - hard to do exercises because of his ankle issues. We tries to walk most days for least an hour.   Family History  family history includes Hyperlipidemia in his father, mother, paternal grandfather, and paternal grandmother; Hypertension in his father, mother, paternal grandfather, and paternal grandmother; Testicular cancer in his father; Thyroid cancer in his sister.  Wt Readings from Last 3 Encounters:  10/16/16 220 lb 12.8 oz (100.2 kg)  05/09/15 210 lb (95.3 kg)  05/07/12 214 lb (97.1 kg)    PHYSICAL EXAM BP (!) 148/90   Pulse 77   Ht 6\' 4"  (1.93 m)   Wt 220 lb 12.8 oz (100.2 kg)   BMI 26.88 kg/m  General appearance: alert, cooperative, appears stated age, no distress. Healthy-appearing. Well-nourished and well-groomed. HEENT: [place]/AT, EOMI, MMM, anicteric sclera Neck: no adenopathy, no carotid bruit and no JVD Lungs: clear to auscultation bilaterally, normal percussion bilaterally and non-labored Heart: regular rate and rhythm, S1 &S2 normal, no murmur, click, rub or gallop; nondisplaced PMI. He was able to point out to me where his symptoms were, but not currently reproducible. Abdomen: soft, non-tender; bowel sounds normal; no masses,  no organomegaly; no HJR Extremities: extremities normal, atraumatic, no cyanosis, or edema  Pulses: 2+ and symmetric;  Neurologic: Mental status: Alert & oriented x 3, thought content appropriate; non-focal exam.  Pleasant mood &  affect. Cranial nerves: normal (II-XII grossly intact)    Adult ECG Report  Rate: 77 ;  Rhythm: normal sinus rhythm and Axis -26 = borderline leftward axis. Otherwise normal intervals and durations.;   Narrative Interpretation: Normal/stable EKG   Other studies Reviewed: Additional studies/ records that were reviewed today include:  Recent Labs: March 2018  Na+ 137, K+ 3.8, Cl- 103, HCO3- 29 , BUN 13, Cr 0.97, Glu 85, Ca2+ 9.8; AST 19, ALT, 25, AlkP 127  CBC: W 7.4, H/H 16.8/47.5, Plt 169  TC 143, TG 287, HDL 30, LDL 56   ASSESSMENT / PLAN: Problem List Items Addressed This Visit    Abnormal EKG    His EKG is essentially normal here, and really not that abnormal from PCP.Marland Kitchen The computer read out for his PCPs EKG was inaccurate as the axis was only -34 and not -45 which be LAFB.      Relevant Orders   EKG 12-Lead (Completed)   Chest wall pain - Primary    He had a prolonged episode about 2  days worth of left-sided chest pain that was not exertional in nature. He has not had any further episodes since. He has been fully active now and is not had any exacerbation of symptoms. 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 He does have history of hyperlipidemia but seems very well controlled by his labs. He is a nonsmoker with borderline high blood pressure in the setting of being anxious in a doctor's office.  Since he has not had any further symptoms, this is a very low likelihood of a cardiac etiology. I don't think we need to do a stress test or any type of screening evaluation for atypical chest pain.   Recommendation: No further evaluation needed. We will reevaluate in 6 months to see if any symptoms recur.      Relevant Orders   EKG 12-Lead (Completed)      Current medicines are reviewed at length with the patient today. (+/- concerns) None The following changes have been made: None  Patient Instructions  No change with  treatment.    Continue to monitor.    Your physician wants you to follow-up in 6 months with Dr Herbie Baltimore. You will receive a reminder letter in the mail two months in advance. If you don't receive a letter, please call our office to schedule the follow-up appointment.    Studies Ordered:   Orders Placed This Encounter  Procedures  . EKG 12-Lead      Bryan Lemma, M.D., M.S. Interventional Cardiologist   Pager # [phone] Phone # [phone] [address] [place], [place] [address]

## 2016-10-16 NOTE — Patient Instructions (Signed)
No change with treatment.    Continue to monitor.    Your physician wants you to follow-up in 6 months with Dr Herbie BaltimoreHARDING. You will receive a reminder letter in the mail two months in advance. If you don't receive a letter, please call our office to schedule the follow-up appointment.

## 2016-10-17 ENCOUNTER — Encounter: Payer: Self-pay | Admitting: Cardiology

## 2016-10-17 DIAGNOSIS — R0789 Other chest pain: Secondary | ICD-10-CM | POA: Insufficient documentation

## 2016-10-17 DIAGNOSIS — R9431 Abnormal electrocardiogram [ECG] [EKG]: Secondary | ICD-10-CM | POA: Insufficient documentation

## 2016-10-17 NOTE — Assessment & Plan Note (Signed)
His EKG is essentially normal here, and really not that abnormal from PCP.Marland Kitchen. The computer read out for his PCPs EKG was inaccurate as the axis was only -34 and not -45 which be LAFB.

## 2016-10-17 NOTE — Assessment & Plan Note (Addendum)
He had a prolonged episode about 2 days worth of left-sided chest pain that was not exertional in nature. He has not had any further episodes since. He has been fully active now and is not had any exacerbation of symptoms. 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 He does have history of hyperlipidemia but seems very well controlled by his labs. He is a nonsmoker with borderline high blood pressure in the setting of being anxious in a doctor's office.  Since he has not had any further symptoms, this is a very low likelihood of a cardiac etiology. I don't think we need to do a stress test or any type of screening evaluation for atypical chest pain.   Recommendation: No further evaluation needed. We will reevaluate in 6 months to see if any symptoms recur.

## 2016-11-01 ENCOUNTER — Emergency Department (INDEPENDENT_AMBULATORY_CARE_PROVIDER_SITE_OTHER)
Admission: EM | Admit: 2016-11-01 | Discharge: 2016-11-01 | Disposition: A | Discharge status: Home / Self Care | Payer: BLUE CROSS/BLUE SHIELD | Source: Home / Self Care | Attending: Family Medicine | Admitting: Family Medicine

## 2016-11-01 ENCOUNTER — Encounter: Payer: Self-pay | Admitting: Emergency Medicine

## 2016-11-01 DIAGNOSIS — M545 Low back pain, unspecified: Secondary | ICD-10-CM

## 2016-11-01 DIAGNOSIS — S161XXA Strain of muscle, fascia and tendon at neck level, initial encounter: Secondary | ICD-10-CM

## 2016-11-01 MED ORDER — TRAMADOL HCL 50 MG PO TABS: 50.0000 mg | ORAL_TABLET | Freq: Four times a day (QID) | ORAL | 0 refills | Status: DC | PRN

## 2016-11-01 MED ORDER — METHOCARBAMOL 500 MG PO TABS: 500.0000 mg | ORAL_TABLET | Freq: Two times a day (BID) | ORAL | 0 refills | Status: DC
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
## 2016-11-01 NOTE — Discharge Instructions (Signed)
°  You may take 500mg  acetaminophen every 4-6 hours or in combination with ibuprofen 400-600mg  every 6-8 hours.  You may want to eat a small snack if you have a history of acid reflux to help prevent stomach upset.    Tramadol is strong pain medication. While taking, do not drink alcohol, drive, or perform any other activities that requires focus while taking these medications.   Methocarbamol (Robaxin) is a muscle relaxer and may cause drowsiness. Do not drink alcohol, drive, or operate heavy machinery while taking.
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
## 2017-01-25 ENCOUNTER — Other Ambulatory Visit: Payer: Self-pay | Admitting: Orthopedic Surgery

## 2017-01-25 DIAGNOSIS — M79671 Pain in right foot: Secondary | ICD-10-CM

## 2017-02-01 ENCOUNTER — Ambulatory Visit
Admission: RE | Admit: 2017-02-01 | Discharge: 2017-02-01 | Disposition: A | Discharge status: Home / Self Care | Payer: BLUE CROSS/BLUE SHIELD | Source: Ambulatory Visit | Attending: Orthopedic Surgery | Admitting: Orthopedic Surgery

## 2017-02-01 DIAGNOSIS — M79671 Pain in right foot: Secondary | ICD-10-CM

## 2017-02-02 ENCOUNTER — Other Ambulatory Visit: Payer: BLUE CROSS/BLUE SHIELD

## 2017-04-20 ENCOUNTER — Emergency Department
Admission: EM | Admit: 2017-04-20 | Discharge: 2017-04-20 | Disposition: A | Discharge status: Home / Self Care | Payer: BLUE CROSS/BLUE SHIELD | Source: Home / Self Care | Attending: Family Medicine | Admitting: Family Medicine

## 2017-04-20 ENCOUNTER — Encounter: Payer: Self-pay | Admitting: Emergency Medicine

## 2017-04-20 ENCOUNTER — Other Ambulatory Visit: Payer: Self-pay

## 2017-04-20 DIAGNOSIS — R350 Frequency of micturition: Secondary | ICD-10-CM | POA: Diagnosis not present

## 2017-04-20 DIAGNOSIS — Z87438 Personal history of other diseases of male genital organs: Secondary | ICD-10-CM

## 2017-04-20 DIAGNOSIS — R030 Elevated blood-pressure reading, without diagnosis of hypertension: Secondary | ICD-10-CM

## 2017-04-20 LAB — POCT URINALYSIS DIP (MANUAL ENTRY)
Bilirubin, UA: NEGATIVE
Glucose, UA: NEGATIVE mg/dL
Ketones, POC UA: NEGATIVE mg/dL
Leukocytes, UA: NEGATIVE
Nitrite, UA: NEGATIVE
Protein Ur, POC: NEGATIVE mg/dL
Spec Grav, UA: 1.025 (ref 1.010–1.025)
Urobilinogen, UA: NEGATIVE E.U./dL — AB
pH, UA: 6.5 (ref 5.0–8.0)

## 2017-04-20 MED ORDER — CIPROFLOXACIN HCL 500 MG PO TABS: 500.0000 mg | ORAL_TABLET | Freq: Two times a day (BID) | ORAL | 0 refills | Status: AC

## 2017-04-20 NOTE — ED Triage Notes (Signed)
Polyuria, pain and pressure in groin x 3 days, hx of prostatitis.

## 2017-04-20 NOTE — ED Provider Notes (Signed)
[name]    CSN: [id number] Arrival date & time: 04/20/17  0803     History   Chief Complaint Chief Complaint  Patient presents with  . Polyuria    HPI Todd Stanley is a 36 y.o. male.   HPI  Todd Stanley is a 36 y.o. male presenting to UC with c/o urinary frequency, discomfort and groin pain for about 3 days.  Hx of prostatitis twice before, last episode was about 2 years ago.  Symptoms feel similar. Pt states he was on Ciprofloxacin last time, initially for 14 days, but needed a full 28 day treatment. He saw his PCP in March 2018 for routine physical, had his PSA checked, it was WNL.  Denies fever, chills, n/v/d. He does have mild back pain but states he was on a ladder hanging Christmas lights the other day, he is unsure if related to his urinary symptoms or muscle pain.  BP elevated in triage. Pt denies hx of HTN and states that is the highest it has been for him that he can recall. Denies HA, chest pain or dizziness.  He does f/u with his PCP routinely.    Past Medical History:  Diagnosis Date  . Chest pain   . Chronic low back pain   . Hyperlipidemia   . Prostatitis    History of  . S/P tonsillectomy     Patient Active Problem List   Diagnosis Date Noted  . Chest wall pain 10/17/2016  . Abnormal EKG 10/17/2016    Past Surgical History:  Procedure Laterality Date  . FOOT SURGERY Bilateral    x3 Fusion subtalar arthrodesis  . TARSAL TUNNEL RELEASE    . TONSILLECTOMY         Home Medications    Prior to Admission medications   Medication Sig Start Date End Date Taking? Authorizing Provider  ciprofloxacin (CIPRO) 500 MG tablet Take 1 tablet (500 mg total) by mouth 2 (two) times daily for 28 days. For up to 28 days 04/20/17 05/18/17  Lurene Shadow, PA-C  HYDROcodone-acetaminophen (NORCO/VICODIN) 5-325 MG tablet Take 1 tablet by mouth as directed.    [provider]  loratadine (CLARITIN) 10 MG tablet Take 10 mg by mouth daily.     [provider]    Family History Family History  Problem Relation Age of Onset  . Hyperlipidemia Mother   . Hypertension Mother   . Hypertension Father   . Hyperlipidemia Father   . Testicular cancer Father   . Thyroid cancer Sister   . Hypertension Paternal Grandmother   . Hyperlipidemia Paternal Grandmother   . Hypertension Paternal Grandfather   . Hyperlipidemia Paternal Grandfather     Social History Social History   Tobacco Use  . Smoking status: Never Smoker  . Smokeless tobacco: Never Used  Substance Use Topics  . Alcohol use: No  . Drug use: No     Allergies   E-mycin [erythromycin base]   Review of Systems Review of Systems  Constitutional: Negative for chills and fever.  Gastrointestinal: Positive for abdominal pain (bilateral groin pain/discomfort). Negative for diarrhea, nausea and vomiting.  Genitourinary: Positive for dysuria, frequency and urgency. Negative for decreased urine volume, discharge, flank pain, hematuria, penile pain, penile swelling, scrotal swelling and testicular pain.  Musculoskeletal: Positive for back pain ( mild, lower). Negative for myalgias.  Neurological: Negative for dizziness, light-headedness and headaches.     Physical Exam Triage Vital Signs ED Triage Vitals  Enc Vitals Group  BP 04/20/17 0828 (!) 154/104     Pulse Rate 04/20/17 0828 89     Resp --      Temp 04/20/17 0828 97.6 F (36.4 C)     Temp Source 04/20/17 0828 Oral     SpO2 04/20/17 0828 99 %     Weight 04/20/17 0829 220 lb (99.8 kg)     Height 04/20/17 0829 6\' 3"  (1.905 m)     Head Circumference --      Peak Flow --      Pain Score 04/20/17 0829 4     Pain Loc --      Pain Edu? --      Excl. in GC? --    No data found.  Updated Vital Signs BP (!) 155/96 (BP Location: Left Arm)   Pulse 89   Temp 97.6 F (36.4 C) (Oral)   Ht 6\' 3"  (1.905 m)   Wt 220 lb (99.8 kg)   SpO2 99%   BMI 27.50 kg/m   Visual Acuity Right Eye Distance:    Left Eye Distance:   Bilateral Distance:    Right Eye Near:   Left Eye Near:    Bilateral Near:     Physical Exam  Constitutional: He is oriented to person, place, and time. He appears well-developed and well-nourished. No distress.  HENT:  Head: Normocephalic and atraumatic.  Mouth/Throat: Oropharynx is clear and moist.  Eyes: EOM are normal.  Neck: Normal range of motion.  Cardiovascular: Normal rate and regular rhythm.  Pulmonary/Chest: Effort normal and breath sounds normal. No stridor. No respiratory distress. He has no wheezes. He has no rales.  Abdominal: Soft. He exhibits no distension and no mass. There is tenderness ( mild across lower abdomen). There is no rebound, no guarding and no CVA tenderness. No hernia.  Musculoskeletal: Normal range of motion.  Neurological: He is alert and oriented to person, place, and time.  Skin: Skin is warm and dry. He is not diaphoretic.  Psychiatric: He has a normal mood and affect. His behavior is normal.  Nursing note and vitals reviewed.    UC Treatments / Results  Labs (all labs ordered are listed, but only abnormal results are displayed) Labs Reviewed  POCT URINALYSIS DIP (MANUAL ENTRY) - Abnormal; Notable for the following components:      Result Value   Color, UA light yellow (*)    Blood, UA trace-intact (*)    Urobilinogen, UA negative (*)    All other components within normal limits    EKG  EKG Interpretation None       Radiology No results found.  Procedures Procedures (including critical care time)  Medications Ordered in UC Medications - No data to display   Initial Impression / Assessment and Plan / UC Course  I have reviewed the triage vital signs and the nursing notes.  Pertinent labs & imaging results that were available during my care of the patient were reviewed by me and considered in my medical decision making (see chart for details).     UA: unremarkable Hx c/w prostatitis, has done well  with Cipro in the past w/o reaction. Will start pt on Cipro at this time. Encouraged f/u with PCP in 2-3 weeks if not improving, sooner if worsening.  BP still elevated during recheck. Encouraged to monitor his BP   Final Clinical Impressions(s) / UC Diagnoses   Final diagnoses:  Urinary frequency  History of prostatitis  Elevated blood pressure reading  ED Discharge Orders        Ordered    ciprofloxacin (CIPRO) 500 MG tablet  2 times daily     04/20/17 0842       Controlled Substance Prescriptions [place] Controlled Substance Registry consulted? Not Applicable   Rolla Platehelps, Phares Zaccone O, PA-C 04/20/17 [id number]

## 2017-05-28 DIAGNOSIS — M545 Low back pain: Secondary | ICD-10-CM

## 2017-05-28 DIAGNOSIS — E785 Hyperlipidemia, unspecified: Secondary | ICD-10-CM | POA: Insufficient documentation

## 2017-05-28 DIAGNOSIS — G8929 Other chronic pain: Secondary | ICD-10-CM | POA: Insufficient documentation

## 2017-05-28 DIAGNOSIS — R079 Chest pain, unspecified: Secondary | ICD-10-CM | POA: Insufficient documentation

## 2017-05-28 DIAGNOSIS — Z9089 Acquired absence of other organs: Secondary | ICD-10-CM | POA: Insufficient documentation

## 2017-05-28 DIAGNOSIS — N419 Inflammatory disease of prostate, unspecified: Secondary | ICD-10-CM | POA: Insufficient documentation

## 2017-06-25 ENCOUNTER — Ambulatory Visit: Payer: BLUE CROSS/BLUE SHIELD | Admitting: Cardiology

## 2017-08-07 ENCOUNTER — Ambulatory Visit: Payer: BLUE CROSS/BLUE SHIELD | Admitting: Cardiology

## 2018-05-17 ENCOUNTER — Emergency Department (INDEPENDENT_AMBULATORY_CARE_PROVIDER_SITE_OTHER)
Admission: EM | Admit: 2018-05-17 | Discharge: 2018-05-17 | Disposition: A | Discharge status: Home / Self Care | Payer: Managed Care, Other (non HMO) | Source: Home / Self Care

## 2018-05-17 ENCOUNTER — Encounter: Payer: Self-pay | Admitting: Emergency Medicine

## 2018-05-17 ENCOUNTER — Other Ambulatory Visit: Payer: Self-pay

## 2018-05-17 DIAGNOSIS — R300 Dysuria: Secondary | ICD-10-CM

## 2018-05-17 MED ORDER — KETOROLAC TROMETHAMINE 60 MG/2ML IM SOLN
60.0000 mg | Freq: Once | INTRAMUSCULAR | Status: AC
  Administered 2018-05-17: 60 mg via INTRAMUSCULAR

## 2018-05-17 MED ORDER — LEVOFLOXACIN 500 MG PO TABS: 500.0000 mg | ORAL_TABLET | Freq: Every day | ORAL | 0 refills | Status: DC

## 2018-05-17 NOTE — ED Triage Notes (Signed)
Urgency, pain and pressure while sitting x 4 days

## 2018-05-17 NOTE — Discharge Instructions (Signed)
See your Physician for recheck.  Continue flomax

## 2018-05-18 LAB — URINE CULTURE
MICRO NUMBER: 91526991
Result:: NO GROWTH
SPECIMEN QUALITY:: ADEQUATE

## 2018-05-18 NOTE — ED Provider Notes (Signed)
[facility]    CSN: [id number] Arrival date & time: 05/17/18  1635     History   Chief Complaint Chief Complaint  Patient presents with  . Prostatitis    HPI Todd Stanley is a 37 y.o. male.   Pt has a history of prostatitis.  Pt complains of acute exacerbation,  Pt followed by Urology.  Pt reports he normally ends up on 28 days of levaquin   The history is provided by the patient. No language interpreter was used.  Dysuria  This is a new problem. The problem has been rapidly worsening. Nothing relieves the symptoms. He has tried nothing for the symptoms.    Past Medical History:  Diagnosis Date  . Chest pain   . Chronic low back pain   . Hyperlipidemia   . Prostatitis    History of  . S/P tonsillectomy     Patient Active Problem List   Diagnosis Date Noted  . S/P tonsillectomy   . Prostatitis   . Hyperlipidemia   . Chronic low back pain   . Chest pain   . Chest wall pain 10/17/2016  . Abnormal EKG 10/17/2016    Past Surgical History:  Procedure Laterality Date  . FOOT SURGERY Bilateral    x3 Fusion subtalar arthrodesis  . TARSAL TUNNEL RELEASE    . TONSILLECTOMY         Home Medications    Prior to Admission medications   Medication Sig Start Date End Date Taking? Authorizing Provider  tamsulosin (FLOMAX) 0.4 MG CAPS capsule Take 0.4 mg by mouth.   Yes [provider]  HYDROcodone-acetaminophen (NORCO/VICODIN) 5-325 MG tablet Take 1 tablet by mouth as directed.    [provider]  levofloxacin (LEVAQUIN) 500 MG tablet Take 1 tablet (500 mg total) by mouth daily. 05/17/18   Elson AreasSofia, Lamar Naef K, PA-C  loratadine (CLARITIN) 10 MG tablet Take 10 mg by mouth daily.    [provider]    Family History Family History  Problem Relation Age of Onset  . Hyperlipidemia Mother   . Hypertension Mother   . Hypertension Father   . Hyperlipidemia Father   . Testicular cancer Father   . Thyroid cancer Sister   .  Hypertension Paternal Grandmother   . Hyperlipidemia Paternal Grandmother   . Hypertension Paternal Grandfather   . Hyperlipidemia Paternal Grandfather     Social History Social History   Tobacco Use  . Smoking status: Never Smoker  . Smokeless tobacco: Never Used  Substance Use Topics  . Alcohol use: No  . Drug use: No     Allergies   E-mycin [erythromycin base]   Review of Systems Review of Systems  Genitourinary: Positive for dysuria.  All other systems reviewed and are negative.    Physical Exam Triage Vital Signs ED Triage Vitals  Enc Vitals Group     BP 05/17/18 1716 (!) 144/109     Pulse Rate 05/17/18 1716 100     Resp --      Temp 05/17/18 1716 97.9 F (36.6 C)     Temp Source 05/17/18 1716 Oral     SpO2 05/17/18 1716 99 %     Weight 05/17/18 1717 215 lb (97.5 kg)     Height 05/17/18 1717 6\' 4"  (1.93 m)     Head Circumference --      Peak Flow --      Pain Score 05/17/18 1717 6     Pain Loc --  Pain Edu? --      Excl. in GC? --    No data found.  Updated Vital Signs BP (!) 144/109 (BP Location: Right Arm)   Pulse 100   Temp 97.9 F (36.6 C) (Oral)   Ht 6\' 4"  (1.93 m)   Wt 215 lb (97.5 kg)   SpO2 99%   BMI 26.17 kg/m   Visual Acuity Right Eye Distance:   Left Eye Distance:   Bilateral Distance:    Right Eye Near:   Left Eye Near:    Bilateral Near:     Physical Exam Vitals signs and nursing note reviewed.  Constitutional:      Appearance: He is well-developed.  HENT:     Head: Normocephalic.     Nose: Nose normal.     Mouth/Throat:     Mouth: Mucous membranes are moist.  Eyes:     Pupils: Pupils are equal, round, and reactive to light.  Neck:     Musculoskeletal: Normal range of motion.  Cardiovascular:     Rate and Rhythm: Normal rate.  Pulmonary:     Effort: Pulmonary effort is normal.  Abdominal:     General: There is no distension.  Musculoskeletal: Normal range of motion.  Neurological:     Mental Status: He  is alert and oriented to person, place, and time.  Psychiatric:        Mood and Affect: Mood normal.      UC Treatments / Results  Labs (all labs ordered are listed, but only abnormal results are displayed) Labs Reviewed  URINE CULTURE    EKG None  Radiology No results found.  Procedures Procedures (including critical care time)  Medications Ordered in UC Medications  ketorolac (TORADOL) injection 60 mg (60 mg Intramuscular Given 05/17/18 1756)    Initial Impression / Assessment and Plan / UC Course  I have reviewed the triage vital signs and the nursing notes.  Pertinent labs & imaging results that were available during my care of the patient were reviewed by me and considered in my medical decision making (see chart for details).     Urine culture pending.  Pt advised to follow up with his Urologist Final Clinical Impressions(s) / UC Diagnoses   Final diagnoses:  Dysuria     Discharge Instructions     See your Physician for recheck.  Continue flomax   ED Prescriptions    Medication Sig Dispense Auth. Provider   levofloxacin (LEVAQUIN) 500 MG tablet Take 1 tablet (500 mg total) by mouth daily. 28 tablet Elson AreasSofia, Christana Angelica K, [place]PA-C     Controlled Substance Prescriptions [place] Controlled Substance Registry consulted? Not Applicable   Elson AreasSofia, Faviola Klare K, [place]-C 05/18/18 [id number]

## 2018-05-19 ENCOUNTER — Telehealth: Payer: Self-pay

## 2018-05-19 NOTE — Telephone Encounter (Signed)
Pt advised of neg ucx results. Advised to f/u with urology for ongoing sxs.

## 2018-08-12 ENCOUNTER — Encounter: Payer: Self-pay | Admitting: Emergency Medicine

## 2018-08-12 ENCOUNTER — Emergency Department (INDEPENDENT_AMBULATORY_CARE_PROVIDER_SITE_OTHER)
Admission: EM | Admit: 2018-08-12 | Discharge: 2018-08-12 | Disposition: A | Discharge status: Home / Self Care | Payer: Managed Care, Other (non HMO) | Source: Home / Self Care | Attending: Emergency Medicine | Admitting: Emergency Medicine

## 2018-08-12 DIAGNOSIS — R059 Cough, unspecified: Secondary | ICD-10-CM

## 2018-08-12 DIAGNOSIS — R05 Cough: Secondary | ICD-10-CM

## 2018-08-12 LAB — POCT INFLUENZA A/B
Influenza A, POC: NEGATIVE
Influenza B, POC: NEGATIVE

## 2018-08-12 MED ORDER — METHYLPREDNISOLONE ACETATE 80 MG/ML IJ SUSP
80.0000 mg | Freq: Once | INTRAMUSCULAR | Status: AC
  Administered 2018-08-12: 80 mg via INTRAMUSCULAR

## 2018-08-12 MED ORDER — PROMETHAZINE-CODEINE 6.25-10 MG/5ML PO SYRP: ORAL_SOLUTION | ORAL | 0 refills | Status: DC

## 2018-08-12 MED ORDER — PREDNISONE 50 MG PO TABS: 50.0000 mg | ORAL_TABLET | Freq: Every day | ORAL | 0 refills | Status: DC

## 2018-08-12 NOTE — Discharge Instructions (Addendum)
Your quick flu tests today are negative. Based on your history and physical exam, no other testing is indicated at this time.  You do not meet the criteria to require coronavirus testing. Most likely, you have an allergic cough, from cutting trees recently. Treatment: Please read attached instruction sheets on cough and coolmist vaporizer. Here in urgent care, shot of Depo-Medrol given. Prescription sent to your pharmacy for strong cough medicine and for 5 days of oral prednisone.  Start the prednisone tomorrow. Rest and push fluids and stay home until you are improving. Follow-up in urgent care, PCP or emergency room if any severe or worsening symptoms.

## 2018-08-12 NOTE — ED Triage Notes (Signed)
Nonproductive cough and left ear pain for more than a week. Denies fever. Daughter dx Friday with flu. Pt traveled to the [place] the first week of February and was there for 3 days. States did not come into contact with anyone there sick.

## 2018-08-12 NOTE — ED Provider Notes (Signed)
[facility]    CSN: [id number] Arrival date & time: 08/12/18  4098     History   Chief Complaint Chief Complaint  Patient presents with  . Cough  . Otalgia    HPI Todd Stanley is a 38 y.o. male.   HPI 38 year old male was well until 7 days ago, started with frequent hacking nonproductive cough.  Has not had any fever or chills or myalgias or arthralgias or chest pain or shortness of breath or rash or nausea or vomiting or abdominal pain or urinary symptoms. The hacking cough is worsening and keeping him up at night.  He thinks this all started from doing vigorous tree cutting 2 days before the coughing started, and he feels this is an allergic cough most  Likely. He went to the [place] and not on vacation from 3/2 through 08/01/2018, did not encounter anyone who is sick and felt well throughout that trip. His daughter had nasal swab verified positive test for type a flu 3 days ago, and she is improving at home.     Past Medical History:  Diagnosis Date  . Chest pain   . Chronic low back pain   . Hyperlipidemia   . Prostatitis    History of  . S/P tonsillectomy     Patient Active Problem List   Diagnosis Date Noted  . S/P tonsillectomy   . Prostatitis   . Hyperlipidemia   . Chronic low back pain   . Chest pain   . Chest wall pain 10/17/2016  . Abnormal EKG 10/17/2016    Past Surgical History:  Procedure Laterality Date  . FOOT SURGERY Bilateral    x3 Fusion subtalar arthrodesis  . TARSAL TUNNEL RELEASE    . TONSILLECTOMY         Home Medications    Prior to Admission medications   Medication Sig Start Date End Date Taking? Authorizing Provider  predniSONE (DELTASONE) 50 MG tablet Take 1 tablet (50 mg total) by mouth daily. With food for 5 days.-Start taking Tuesday 3/17 08/12/18   Lajean Manes, MD  promethazine-codeine [facility] WITH CODEINE) 6.25-10 MG/5ML syrup Take 1-2 teaspoons every 4-6 hours as needed for cough. May cause  drowsiness. 08/12/18   Lajean Manes, MD    Family History Family History  Problem Relation Age of Onset  . Hyperlipidemia Mother   . Hypertension Mother   . Hypertension Father   . Hyperlipidemia Father   . Testicular cancer Father   . Thyroid cancer Sister   . Hypertension Paternal Grandmother   . Hyperlipidemia Paternal Grandmother   . Hypertension Paternal Grandfather   . Hyperlipidemia Paternal Grandfather     Social History Social History   Tobacco Use  . Smoking status: Never Smoker  . Smokeless tobacco: Never Used  Substance Use Topics  . Alcohol use: No  . Drug use: No     Allergies   E-mycin [erythromycin base]   Review of Systems Review of Systems  All other systems reviewed and are negative.  Pertinent items noted in HPI and remainder of comprehensive ROS otherwise negative.   Physical Exam Triage Vital Signs ED Triage Vitals  Enc Vitals Group     BP      Pulse      Resp      Temp      Temp src      SpO2      Weight      Height      Head  Circumference      Peak Flow      Pain Score      Pain Loc      Pain Edu?      Excl. in GC?    No data found.  Updated Vital Signs BP (!) 133/101 (BP Location: Left Arm)   Pulse (!) 140   Temp 97.7 F (36.5 C) (Oral)   Resp 20   Ht 6\' 3"  (1.905 m)   Wt 97.5 kg   SpO2 100%   BMI 26.87 kg/m   Visual Acuity Right Eye Distance:   Left Eye Distance:   Bilateral Distance:    Right Eye Near:   Left Eye Near:    Bilateral Near:     Physical Exam Vitals signs and nursing note reviewed.  Constitutional:      General: He is not in acute distress.    Appearance: He is well-developed.     Comments: Hacking nonproductive cough noted frequently.  He is in no acute distress.  Nontoxic appearance.  He does not appear ill.  He is alert, cooperative.  HENT:     Head: Normocephalic and atraumatic.     Right Ear: Tympanic membrane normal.     Left Ear: Tympanic membrane normal.     Ears:      Comments: Both TMs within normal limits.  External ears nontender.    Nose: Nose normal.     Comments: Minimal nasal congestion otherwise nose normal    Mouth/Throat:     Mouth: Mucous membranes are moist.     Pharynx: Oropharynx is clear. No oropharyngeal exudate or posterior oropharyngeal erythema.  Eyes:     General: No scleral icterus.       Right eye: No discharge.        Left eye: No discharge.  Neck:     Musculoskeletal: Neck supple.  Cardiovascular:     Rate and Rhythm: Normal rate and regular rhythm.     Heart sounds: Normal heart sounds.  Pulmonary:     Effort: Pulmonary effort is normal. No respiratory distress.     Breath sounds: Normal breath sounds. No wheezing, rhonchi or rales.     Comments: Lungs clear to auscultation.  Breath sounds equal bilaterally.  No wheezes or rhonchi or stridor or rales. Abdominal:     General: Abdomen is flat. There is no distension.     Palpations: Abdomen is soft.  Musculoskeletal: Normal range of motion.        General: No tenderness.  Lymphadenopathy:     Cervical: No cervical adenopathy.  Skin:    General: Skin is warm and dry.     Capillary Refill: Capillary refill takes less than 2 seconds.  Neurological:     Mental Status: He is alert and oriented to person, place, and time.  Psychiatric:        Mood and Affect: Mood normal.      UC Treatments / Results  Labs (all labs ordered are listed, but only abnormal results are displayed) Labs Reviewed  POCT INFLUENZA A/B    EKG None  Radiology No results found.  Procedures Procedures (including critical care time)  Medications Ordered in UC Medications  methylPREDNISolone acetate (DEPO-MEDROL) injection 80 mg (80 mg Intramuscular Given 08/12/18 1026)    Initial Impression / Assessment and Plan / UC Course  I have reviewed the triage vital signs and the nursing notes.  Pertinent labs & imaging results that were available during my care of the  patient were reviewed by  me and considered in my medical decision making (see chart for details).    Rapid influenza test today negative.  He declined other testing at this time. Cough is most likely allergic cough as he has had no fever or signs or symptoms of infection.  He otherwise appears well and does not appear ill or toxic. Chest x-ray and other testing not indicated. Based on recommendations, coronavirus testing not indicated in his case.  He agrees.  We will treat with Depo-Medrol 80 mg IM stat, oral steroid burst and judicious use of prescription cough med.  Precautions discussed.  Final Clinical Impressions(s) / UC Diagnoses   Final diagnoses:  Cough     Discharge Instructions     Your quick flu tests today are negative. Based on your history and physical exam, no other testing is indicated at this time.  You do not meet the criteria to require coronavirus testing. Most likely, you have an allergic cough, from cutting trees recently. Treatment: Please read attached instruction sheets on cough and coolmist vaporizer. Here in urgent care, shot of Depo-Medrol given. Prescription sent to your pharmacy for strong cough medicine and for 5 days of oral prednisone.  Start the prednisone tomorrow. Rest and push fluids and stay home until you are improving. Follow-up in urgent care, PCP or emergency room if any severe or worsening symptoms.    ED Prescriptions    Medication Sig Dispense Auth. Provider   predniSONE (DELTASONE) 50 MG tablet Take 1 tablet (50 mg total) by mouth daily. With food for 5 days.-Start taking Tuesday 3/17 5 tablet Lajean Manes, MD   promethazine-codeine [facility] WITH CODEINE) 6.25-10 MG/5ML syrup Take 1-2 teaspoons every 4-6 hours as needed for cough. May cause drowsiness. 120 mL Lajean Manes, MD     Follow-up with your primary care doctor in 5-7 days if not improving, or sooner if symptoms become worse. Precautions discussed. Red flags discussed. Questions invited and  answered. Patient voiced understanding and agreement.    Controlled Substance Prescriptions [place] Controlled Substance Registry consulted? Yes, I have consulted the [place] Controlled Substances Registry for this patient, and feel the risk/benefit ratio today is favorable for proceeding with this prescription for a controlled substance.   Lajean Manes, MD 08/12/18 1038

## 2019-08-12 ENCOUNTER — Ambulatory Visit: Payer: Managed Care, Other (non HMO) | Admitting: Cardiology

## 2019-09-16 ENCOUNTER — Other Ambulatory Visit: Payer: Self-pay

## 2019-09-16 ENCOUNTER — Ambulatory Visit (INDEPENDENT_AMBULATORY_CARE_PROVIDER_SITE_OTHER): Payer: Managed Care, Other (non HMO) | Admitting: Cardiology

## 2019-09-16 ENCOUNTER — Encounter: Payer: Self-pay | Admitting: Cardiology

## 2019-09-16 DIAGNOSIS — R002 Palpitations: Secondary | ICD-10-CM

## 2019-09-16 NOTE — Progress Notes (Signed)
Primary Care Provider: Johny Blamer, MD Cardiologist: No primary care provider on file. Electrophysiologist: None  Clinic Note: No chief complaint on file.   HPI:    Todd Stanley is a otherwise healthy 39 y.o. male who is being seen today for the evaluation of rapid palpitations at the request of Johny Blamer, MD.  Recent Hospitalizations: Was not hospitalized, but quarantined at home for 10 days for COVID-19 infection in August (starting December 31, 2018).  Required oxygen, inhalers or any adjunctive therapy.  Only residual symptom now is reduced sense of of taste and smell.  Reviewed  CV studies:    The following studies were reviewed today: (if available, images/films reviewed: From Epic Chart or Care Everywhere) . He has never had any studies.   Interval History:   Todd Stanley was originally referred for chest discomfort several months ago, but Vencil chose not to, at that time, however he decided to come now because has been having episodes of increased heart rates lasting up to 5 minutes that really started around the time of his COVID-19 infection last August.  He was tested positive on August 4 but did not have any significant respiratory issues.  Just a week or so of significant coughing and myalgias/arthralgias.  Also loss of sense of taste and smell.  What he started noticing about a week or so after recovery was that he was having pretty frequent almost every day or couple times a day spells of his heart out of nowhere going up into the 130s to 104 to be per minute range and then all of a sudden go right back to normal.  He monitored on his apple watch while watching TV 1 night that his heart rate went from 68 beats a minute 140 beats a minute.  He felt his heart going fast, but had no chest pain or pressure.  Maybe felt a little bit dizzy and short of breath after few minutes, but about the time he started having symptoms it spontaneously resolved.  He He says that  the spells have been happening with much less frequency and shorter durations over the last couple months and this last month he is probably only had maybe 1 or 2 spells.  They are to the point now where he thinks that they are almost fully resolved. Otherwise is not really having any notable symptoms from either the Covid infection or syncope near syncope chest pain etc.  CV Review of Symptoms (Summary) Cardiovascular ROS: positive for - palpitations, rapid heart rate and Noted in HPI. negative for - dyspnea on exertion, edema, irregular heartbeat, loss of consciousness, orthopnea, paroxysmal nocturnal dyspnea, shortness of breath or No longer having chest pain or pressure symptoms like he was having last fall.  The patient does not have symptoms concerning for COVID-19 infection (fever, chills, cough, or new shortness of breath).  He had positive COVID-19 infection in August 2020 The patient is practicing social distancing & Masking.    REVIEWED OF SYSTEMS   Review of Systems  Constitutional: Negative for malaise/fatigue and weight loss.  HENT: Negative for congestion and nosebleeds.   Respiratory: Negative for cough (No more) and shortness of breath.   Gastrointestinal: Negative for blood in stool and melena.  Genitourinary: Negative for hematuria.  Musculoskeletal: Negative for joint pain.  Neurological: Negative for dizziness, focal weakness and headaches.  Psychiatric/Behavioral: Negative for memory loss. The patient is not nervous/anxious and does not have insomnia.    I have reviewed  and (if needed) personally updated the patient's problem list, medications, allergies, past medical and surgical history, social and family history.   PAST MEDICAL HISTORY   Past Medical History:  Diagnosis Date  . Chest pain   . Chronic low back pain   . Hyperlipidemia   . Prostatitis    History of  . S/P tonsillectomy     PAST SURGICAL HISTORY   Past Surgical History:  Procedure  Laterality Date  . FOOT SURGERY Bilateral    x3 Fusion subtalar arthrodesis  . TARSAL TUNNEL RELEASE    . TONSILLECTOMY      MEDICATIONS/ALLERGIES   Current Meds  Medication Sig  . lidocaine (XYLOCAINE) 2 % solution Use as directed 15 mLs in the mouth or throat every 3 hours.  Marland Kitchen olmesartan (BENICAR) 5 MG tablet olmesartan 5 mg tablet  TAKE 1 TABLET BY MOUTH EVERY DAY    Allergies  Allergen Reactions  . E-Mycin [Erythromycin Base] Rash    SOCIAL HISTORY/FAMILY HISTORY   Social History   Tobacco Use  . Smoking status: Never Smoker  . Smokeless tobacco: Never Used  Substance Use Topics  . Alcohol use: No  . Drug use: No   Social History   Social History Narrative   Married with one boy and one girl.   No structured exercise program - hard to do exercises because of his ankle issues. We tries to walk most days for least an hour.   / Family History  Problem Relation Age of Onset  . Hyperlipidemia Mother   . Hypertension Mother   . Hypertension Father   . Hyperlipidemia Father   . Testicular cancer Father   . Thyroid cancer Sister   . Hypertension Paternal Grandmother   . Hyperlipidemia Paternal Grandmother   . Hypertension Paternal Grandfather   . Hyperlipidemia Paternal Grandfather      OBJCTIVE -PE, EKG, labs   Wt Readings from Last 3 Encounters:  09/16/19 220 lb (99.8 kg)  08/12/18 215 lb (97.5 kg)  05/17/18 215 lb (97.5 kg)    Physical Exam: BP (!) 128/96   Pulse 92   Temp (!) 97.2 F (36.2 C)   Ht 6\' 4"  (1.93 m)   Wt 220 lb (99.8 kg)   SpO2 99%   BMI 26.78 kg/m  Physical Exam  Constitutional: He is oriented to person, place, and time. He appears well-developed and well-nourished. No distress.  Pleasant, healthy-appearing gentleman.  Well-groomed  HENT:  Head: Normocephalic and atraumatic.  Neck: No JVD present.  Cardiovascular: Normal rate, regular rhythm, normal heart sounds and intact distal pulses. Exam reveals no gallop and no friction  rub.  No murmur heard. Pulmonary/Chest: Effort normal and breath sounds normal. No respiratory distress. He has no wheezes. He has no rales.  Musculoskeletal:        General: No edema. Normal range of motion.  Neurological: He is alert and oriented to person, place, and time. No cranial nerve deficit.  Psychiatric: He has a normal mood and affect. His behavior is normal. Judgment and thought content normal.  Vitals reviewed.    Adult ECG Report  Rate: 92 ;  Rhythm: normal sinus rhythm, sinus arrhythmia and Otherwise normal axis, intervals durations.;   Narrative Interpretation: Relative normal EKG.  Recent Labs: N/A No results found for: CHOL, HDL, LDLCALC, LDLDIRECT, TRIG, CHOLHDL No results found for: CREATININE, BUN, NA, K, CL, CO2 No results found for: TSH  ASSESSMENT/PLAN    Problem List Items Addressed This Visit  Rapid palpitations    Davit is describing having short lived bursts of tachycardia that are relatively self-limiting.  The CDs of the rates are not fast enough to suggest SVT.  That is the most likely cause as PAT is less likely given his age.  Atrial flutter would be faster than 130-140 bpm, and A. fib would be irregular -> he notes that his pulses are regular.  At this point, the episodes are not happening with enough frequency that we can guarantee capturing anything on a monitor.  I talked about using his apple watch functionality or consider the Kardia monitor APP.    He says that these are really becoming less and less concerning to him.  If they do start happening more frequently, he will let us know and we can order monitor. At present, I would be reluctant to treat unless and I would like him treating.   Plan is to reevaluate in 3 months to see how his symptoms are going.  If he is still noticing symptoms, we will discuss wearing a monitor.  Would try to do the best we can not to treat with medicines.  We discussed biofeedback techniques and vagal  maneuvers.          COVID-19 Education: The signs and symptoms of COVID-19 were discussed with the patient and how to seek care for testing (follow up with PCP or arrange E-visit).   The importance of social distancing and COVID-19 vaccination was discussed today.  I spent a total of with the patient. >  50% of the time was spent in direct patient consultation.  Additional time spent with chart review  / charting (studies, outside notes, etc): 8 Total Time: 26 min   Current medicines are reviewed at length with the patient today.  (+/- concerns) n/a  Notice: This dictation was prepared with Dragon dictation along with smaller phrase technology. Any transcriptional errors that result from this process are unintentional and may not be corrected upon review.  Patient Instructions / Medication Changes & Studies & Tests Ordered   Patient Instructions  Medication Instructions:  The current medical regimen is effective;  continue present plan and medications as directed. Please refer to the Current Medication list given to you today. *If you need a refill on your cardiac medications before your next appointment, please call your pharmacy*  Special Instructions CALL IF PALPITATIONS ARE MORE FREQUENT AND WILL RE-ASSESS AT THAT TIME  Follow-Up: Your next appointment:  3 month(s) Please call our office 2 months in advance to schedule this appointment In Person with Bryan Lemma, MD  At [facility], you and your health needs are our priority.  As part of our continuing mission to provide you with exceptional heart care, we have created designated Provider Care Teams.  These Care Teams include your primary Cardiologist (physician) and Advanced Practice Providers (APPs -  Physician Assistants and Nurse Practitioners) who all work together to provide you with the care you need, when you need it.  We recommend signing up for the patient portal called "MyChart".  Sign up information  is provided on this After Visit Summary.  MyChart is used to connect with patients for Virtual Visits (Telemedicine).  Patients are able to view lab/test results, encounter notes, upcoming appointments, etc.  Non-urgent messages can be sent to your provider as well.   To learn more about what you can do with MyChart, go to [email or web address].         Studies Ordered:  No orders of the defined types were placed in this encounter.    Glenetta Hew, M.D., M.S. Interventional Cardiologist   Pager # [phone] Phone # [phone] [address] [place], [place] [address]   Thank you for choosing Heartcare at [facility]!!

## 2019-09-16 NOTE — Patient Instructions (Signed)
Medication Instructions:  The current medical regimen is effective;  continue present plan and medications as directed. Please refer to the Current Medication list given to you today. *If you need a refill on your cardiac medications before your next appointment, please call your pharmacy*  Special Instructions CALL IF PALPITATIONS ARE MORE FREQUENT AND WILL RE-ASSESS AT THAT TIME  Follow-Up: Your next appointment:  3 month(s) Please call our office 2 months in advance to schedule this appointment In Person with Bryan Lemma, MD  At [facility], you and your health needs are our priority.  As part of our continuing mission to provide you with exceptional heart care, we have created designated Provider Care Teams.  These Care Teams include your primary Cardiologist (physician) and Advanced Practice Providers (APPs -  Physician Assistants and Nurse Practitioners) who all work together to provide you with the care you need, when you need it.  We recommend signing up for the patient portal called "MyChart".  Sign up information is provided on this After Visit Summary.  MyChart is used to connect with patients for Virtual Visits (Telemedicine).  Patients are able to view lab/test results, encounter notes, upcoming appointments, etc.  Non-urgent messages can be sent to your provider as well.   To learn more about what you can do with MyChart, go to [email or web address].

## 2019-09-19 DIAGNOSIS — R002 Palpitations: Secondary | ICD-10-CM | POA: Insufficient documentation

## 2019-09-19 NOTE — Assessment & Plan Note (Signed)
Camron is describing having short lived bursts of tachycardia that are relatively self-limiting.  The CDs of the rates are not fast enough to suggest SVT.  That is the most likely cause as PAT is less likely given his age.  Atrial flutter would be faster than 130-140 bpm, and A. fib would be irregular -> he notes that his pulses are regular.  At this point, the episodes are not happening with enough frequency that we can guarantee capturing anything on a monitor.  I talked about using his apple watch functionality or consider the Kardia monitor APP.    He says that these are really becoming less and less concerning to him.  If they do start happening more frequently, he will let us know and we can order monitor. At present, I would be reluctant to treat unless and I would like him treating.   Plan is to reevaluate in 3 months to see how his symptoms are going.  If he is still noticing symptoms, we will discuss wearing a monitor.  Would try to do the best we can not to treat with medicines.  We discussed biofeedback techniques and vagal maneuvers.

## 2019-09-25 NOTE — Addendum Note (Signed)
Addended by: Myna Hidalgo A on: 09/25/2019 08:13 AM   Modules accepted: Orders

## 2020-01-12 ENCOUNTER — Ambulatory Visit: Payer: Managed Care, Other (non HMO) | Admitting: Cardiology

## 2020-02-16 ENCOUNTER — Ambulatory Visit: Payer: Self-pay | Admitting: Cardiology

## 2020-03-31 ENCOUNTER — Telehealth: Payer: Self-pay | Admitting: Cardiology

## 2020-03-31 ENCOUNTER — Encounter: Payer: Self-pay | Admitting: General Practice

## 2020-03-31 NOTE — Telephone Encounter (Signed)
  We have attempted to contact patient 3 times to schedule 3 month f/u recall appt, recall expunge letter sent, recall deleted
